# Patient Record
Sex: Female | Born: 1940
Health system: Southern US, Community
[De-identification: ages and names within clinical notes are randomized; demographics above are authoritative.]

## PROBLEM LIST (undated history)

## (undated) DIAGNOSIS — C9110 Chronic lymphocytic leukemia of B-cell type not having achieved remission: Principal | ICD-10-CM

## (undated) HISTORY — DX: Chronic lymphocytic leukemia of B-cell type not having achieved remission: C91.10

---

## 1997-09-17 ENCOUNTER — Ambulatory Visit (HOSPITAL_COMMUNITY): Admission: RE | Admit: 1997-09-17 | Discharge: 1997-09-17 | Payer: Self-pay | Admitting: Internal Medicine

## 1998-12-23 ENCOUNTER — Ambulatory Visit (HOSPITAL_COMMUNITY): Admission: RE | Admit: 1998-12-23 | Discharge: 1998-12-23 | Payer: Self-pay | Admitting: Internal Medicine

## 1998-12-23 ENCOUNTER — Encounter: Payer: Self-pay | Admitting: Internal Medicine

## 2000-01-10 ENCOUNTER — Encounter: Payer: Self-pay | Admitting: Internal Medicine

## 2000-01-10 ENCOUNTER — Ambulatory Visit (HOSPITAL_COMMUNITY): Admission: RE | Admit: 2000-01-10 | Discharge: 2000-01-10 | Payer: Self-pay | Admitting: Internal Medicine

## 2001-01-11 ENCOUNTER — Encounter: Payer: Self-pay | Admitting: Internal Medicine

## 2001-01-11 ENCOUNTER — Ambulatory Visit (HOSPITAL_COMMUNITY): Admission: RE | Admit: 2001-01-11 | Discharge: 2001-01-11 | Payer: Self-pay | Admitting: Internal Medicine

## 2002-03-21 ENCOUNTER — Ambulatory Visit (HOSPITAL_COMMUNITY): Admission: RE | Admit: 2002-03-21 | Discharge: 2002-03-21 | Payer: Self-pay | Admitting: Internal Medicine

## 2002-03-21 ENCOUNTER — Encounter: Payer: Self-pay | Admitting: Internal Medicine

## 2003-04-30 ENCOUNTER — Ambulatory Visit (HOSPITAL_COMMUNITY): Admission: RE | Admit: 2003-04-30 | Discharge: 2003-04-30 | Payer: Self-pay | Admitting: Internal Medicine

## 2004-11-30 ENCOUNTER — Ambulatory Visit (HOSPITAL_COMMUNITY): Admission: RE | Admit: 2004-11-30 | Discharge: 2004-11-30 | Payer: Self-pay | Admitting: Internal Medicine

## 2005-01-05 ENCOUNTER — Ambulatory Visit: Payer: Self-pay | Admitting: Internal Medicine

## 2006-03-31 ENCOUNTER — Ambulatory Visit (HOSPITAL_COMMUNITY): Admission: RE | Admit: 2006-03-31 | Discharge: 2006-03-31 | Payer: Self-pay | Admitting: Internal Medicine

## 2006-05-15 ENCOUNTER — Ambulatory Visit: Payer: Self-pay | Admitting: Gastroenterology

## 2006-05-24 ENCOUNTER — Ambulatory Visit: Payer: Self-pay | Admitting: Gastroenterology

## 2007-04-09 ENCOUNTER — Ambulatory Visit (HOSPITAL_COMMUNITY): Admission: RE | Admit: 2007-04-09 | Discharge: 2007-04-09 | Payer: Self-pay | Admitting: Internal Medicine

## 2008-04-24 ENCOUNTER — Ambulatory Visit (HOSPITAL_COMMUNITY): Admission: RE | Admit: 2008-04-24 | Discharge: 2008-04-24 | Payer: Self-pay | Admitting: Internal Medicine

## 2009-05-15 ENCOUNTER — Ambulatory Visit (HOSPITAL_COMMUNITY): Admission: RE | Admit: 2009-05-15 | Discharge: 2009-05-15 | Payer: Self-pay | Admitting: Internal Medicine

## 2009-06-19 DIAGNOSIS — E669 Obesity, unspecified: Secondary | ICD-10-CM | POA: Insufficient documentation

## 2009-06-19 DIAGNOSIS — E663 Overweight: Secondary | ICD-10-CM | POA: Insufficient documentation

## 2009-06-19 DIAGNOSIS — M1990 Unspecified osteoarthritis, unspecified site: Secondary | ICD-10-CM | POA: Insufficient documentation

## 2009-06-19 DIAGNOSIS — E785 Hyperlipidemia, unspecified: Secondary | ICD-10-CM | POA: Insufficient documentation

## 2009-06-19 DIAGNOSIS — J302 Other seasonal allergic rhinitis: Secondary | ICD-10-CM | POA: Insufficient documentation

## 2009-07-20 ENCOUNTER — Inpatient Hospital Stay (HOSPITAL_COMMUNITY): Admission: RE | Admit: 2009-07-20 | Discharge: 2009-07-23 | Payer: Self-pay | Admitting: Orthopedic Surgery

## 2009-09-22 DIAGNOSIS — R739 Hyperglycemia, unspecified: Secondary | ICD-10-CM | POA: Insufficient documentation

## 2009-12-09 ENCOUNTER — Ambulatory Visit (HOSPITAL_COMMUNITY): Admission: RE | Admit: 2009-12-09 | Discharge: 2009-12-09 | Payer: Self-pay | Admitting: Orthopedic Surgery

## 2009-12-09 ENCOUNTER — Ambulatory Visit: Payer: Self-pay | Admitting: Surgery

## 2009-12-09 ENCOUNTER — Encounter (INDEPENDENT_AMBULATORY_CARE_PROVIDER_SITE_OTHER): Payer: Self-pay | Admitting: Orthopedic Surgery

## 2009-12-10 ENCOUNTER — Ambulatory Visit: Payer: Self-pay | Admitting: Vascular Surgery

## 2010-06-09 ENCOUNTER — Ambulatory Visit (HOSPITAL_COMMUNITY)
Admission: RE | Admit: 2010-06-09 | Discharge: 2010-06-09 | Payer: Self-pay | Source: Home / Self Care | Attending: Internal Medicine | Admitting: Internal Medicine

## 2010-07-06 ENCOUNTER — Other Ambulatory Visit: Payer: Self-pay | Admitting: Orthopedic Surgery

## 2010-07-12 ENCOUNTER — Inpatient Hospital Stay (HOSPITAL_COMMUNITY)
Admission: RE | Admit: 2010-07-12 | Discharge: 2010-07-15 | Payer: Self-pay | Source: Home / Self Care | Attending: Orthopedic Surgery | Admitting: Orthopedic Surgery

## 2010-07-19 LAB — CBC
HCT: 34 % — ABNORMAL LOW (ref 36.0–46.0)
HCT: 29.9 % — ABNORMAL LOW (ref 36.0–46.0)
HCT: 27.9 % — ABNORMAL LOW (ref 36.0–46.0)
Hemoglobin: 11.2 g/dL — ABNORMAL LOW (ref 12.0–15.0)
Hemoglobin: 10.1 g/dL — ABNORMAL LOW (ref 12.0–15.0)
Hemoglobin: 9.3 g/dL — ABNORMAL LOW (ref 12.0–15.0)
MCH: 30.2 pg (ref 26.0–34.0)
MCH: 30.1 pg (ref 26.0–34.0)
MCH: 30 pg (ref 26.0–34.0)
MCHC: 32.9 g/dL (ref 30.0–36.0)
MCHC: 33.8 g/dL (ref 30.0–36.0)
MCHC: 33.3 g/dL (ref 30.0–36.0)
MCV: 91.6 fL (ref 78.0–100.0)
MCV: 89.3 fL (ref 78.0–100.0)
MCV: 90 fL (ref 78.0–100.0)
Platelets: 248 10*3/uL (ref 150–400)
Platelets: 184 10*3/uL (ref 150–400)
Platelets: 185 10*3/uL (ref 150–400)
RBC: 3.71 MIL/uL — ABNORMAL LOW (ref 3.87–5.11)
RBC: 3.35 MIL/uL — ABNORMAL LOW (ref 3.87–5.11)
RBC: 3.1 MIL/uL — ABNORMAL LOW (ref 3.87–5.11)
RDW: 14.4 % (ref 11.5–15.5)
RDW: 14.1 % (ref 11.5–15.5)
RDW: 14.4 % (ref 11.5–15.5)
WBC: 20.7 10*3/uL — ABNORMAL HIGH (ref 4.0–10.5)
WBC: 19.1 10*3/uL — ABNORMAL HIGH (ref 4.0–10.5)
WBC: 16.2 10*3/uL — ABNORMAL HIGH (ref 4.0–10.5)

## 2010-07-19 LAB — BASIC METABOLIC PANEL
BUN: 10 mg/dL (ref 6–23)
BUN: 5 mg/dL — ABNORMAL LOW (ref 6–23)
BUN: 8 mg/dL (ref 6–23)
CO2: 27 mEq/L (ref 19–32)
CO2: 29 mEq/L (ref 19–32)
CO2: 29 mEq/L (ref 19–32)
Calcium: 8.6 mg/dL (ref 8.4–10.5)
Calcium: 8.7 mg/dL (ref 8.4–10.5)
Calcium: 8.6 mg/dL (ref 8.4–10.5)
Chloride: 102 mEq/L (ref 96–112)
Chloride: 98 mEq/L (ref 96–112)
Chloride: 100 mEq/L (ref 96–112)
Creatinine, Ser: 0.65 mg/dL (ref 0.4–1.2)
Creatinine, Ser: 0.56 mg/dL (ref 0.4–1.2)
Creatinine, Ser: 0.62 mg/dL (ref 0.4–1.2)
GFR calc Af Amer: >60 mL/min (ref >60)
GFR calc Af Amer: >60 mL/min (ref >60)
GFR calc Af Amer: >60 mL/min (ref >60)
GFR calc non Af Amer: >60 mL/min (ref >60)
GFR calc non Af Amer: >60 mL/min (ref >60)
GFR calc non Af Amer: >60 mL/min (ref >60)
Glucose, Bld: 152 mg/dL — ABNORMAL HIGH (ref 70–99)
Glucose, Bld: 158 mg/dL — ABNORMAL HIGH (ref 70–99)
Glucose, Bld: 123 mg/dL — ABNORMAL HIGH (ref 70–99)
Potassium: 4 mEq/L (ref 3.5–5.1)
Potassium: 3.9 mEq/L (ref 3.5–5.1)
Potassium: 3.8 mEq/L (ref 3.5–5.1)
Sodium: 137 mEq/L (ref 135–145)
Sodium: 133 mEq/L — ABNORMAL LOW (ref 135–145)
Sodium: 137 mEq/L (ref 135–145)

## 2010-07-19 LAB — DIFFERENTIAL
Basophils Absolute: 0 10*3/uL (ref 0.0–0.1)
Basophils Relative: 0 % (ref 0–1)
Eosinophils Absolute: 0 10*3/uL (ref 0.0–0.7)
Eosinophils Relative: 0 % (ref 0–5)
Lymphocytes Relative: 46 % (ref 12–46)
Lymphs Abs: 8.8 10*3/uL — ABNORMAL HIGH (ref 0.7–4.0)
Monocytes Absolute: 1.5 10*3/uL — ABNORMAL HIGH (ref 0.1–1.0)
Monocytes Relative: 8 % (ref 3–12)
Neutro Abs: 8.8 10*3/uL — ABNORMAL HIGH (ref 1.7–7.7)
Neutrophils Relative %: 46 % (ref 43–77)

## 2010-07-19 LAB — PROTIME-INR
INR: 1.11 (ref 0.00–1.49)
INR: 1.44 (ref 0.00–1.49)
INR: 1.79 — ABNORMAL HIGH (ref 0.00–1.49)
Prothrombin Time: 14.5 seconds (ref 11.6–15.2)
Prothrombin Time: 17.7 seconds — ABNORMAL HIGH (ref 11.6–15.2)
Prothrombin Time: 21 seconds — ABNORMAL HIGH (ref 11.6–15.2)

## 2010-07-19 LAB — TYPE AND SCREEN
ABO/RH(D): O NEG
Antibody Screen: NEGATIVE

## 2010-07-19 LAB — PATHOLOGIST SMEAR REVIEW

## 2010-07-25 ENCOUNTER — Encounter: Payer: Self-pay | Admitting: Internal Medicine

## 2010-08-25 NOTE — Discharge Summary (Signed)
NAMECAYLEA, [name]                ACCOUNT NO.:  [id number]  MEDICAL RECORD NO.:  [id number]          PATIENT TYPE:  INP  LOCATION:  1613                         FACILITY:  [facility]  PHYSICIAN:  Ollen Gross, M.D.    DATE OF BIRTH:  [birth year]  DATE OF ADMISSION:  07/12/2010 DATE OF DISCHARGE:  07/15/2010                              DISCHARGE SUMMARY   ADMITTING DIAGNOSES: 1. Osteoarthritis, left knee. 2. Seasonal allergies. 3. Osteoarthritis. 4. Hyperlipidemia. 5. Past history of pneumonia. 6. Postmenopausal. 7. Childhood illnesses of measles, mumps and rubella. 8. Chronic leukocytosis.  DISCHARGE DIAGNOSES: 1. Osteoarthritis, left knee, status post left total knee replacement     arthroplasty. 2. Postop hyponatremia, improved. 3. Mild postop acute blood loss anemia, did not require transfusion. 4. Leukocytosis. 5. Seasonal allergies. 6. Osteoarthritis. 7. Hyperlipidemia. 8. Past history of pneumonia. 9. Postmenopausal. 10.Childhood illnesses of measles, mumps and rubella.  PROCEDURE:  07/12/2010 left total knee.  Surgeon Dr. Lequita Halt.  Assistant Avel Peace PA-C.  Anesthesia general.  Tourniquet time 34 minutes.  CONSULTS:  Internal medicine, Dr. Creola Corn.  BRIEF HISTORY:  [name] is a 70 year old female with end-stage osteoarthritis of left knee, progressive worsening pain, dysfunction, successful right total knee, who now presents for a left total knee.  PERTINENT LABORATORY AND X-RAY DATA:  Preop CBC showed a slightly elevated white count of 15.5, hemoglobin of 14.9, hematocrit of 45.2, platelets 269.  PT/INR was 13.8, 1.04 with PTT of 35.  Chem panel on admission all within normal limits with the exception of minimally elevated glucose of 113.  Preop UA negative.  Blood group type O negative.  Nasal swabs were negative for Staph aureus MRSA.  Serial CBCs were followed.  Hemoglobin dropped down to 11.2 and 10.1, last done H&H showed 9.3.  The white count  did go up probably do to a stress reaction after surgery from 15.5 to 20.7, back down to 19.11; white count was last noted at 16.2 near back to baseline.  Serial Pro-times followed per Coumadin protocol; last done PT/INR 21.0 and 1.79.  Serial BMETs were followed for 3 days.  His sodium did drop from 140 to 137, got as low as 133, it was back up to 137.  Glucose went up from 113, got as high as 158, back down to 123.  Chest x-ray, portable chest on 07/13/2010, scar versus atelectasis within the left lung base.  HOSPITAL COURSE:  The patient was admitted to [facility], taken to OR, underwent the above-stated procedure without complication. The patient tolerated the procedure well, later transferred to the recovery room, orthopedic floor, started on p.o. and IV analgesic for pain control following surgery.  Courtesy medical consult by Dr. Timothy Lasso, the patient was seen and noted to have leukocytosis.  The patient was known to have chronic baseline white count from 11-15 and the white count went up to 20 postop.  It was felt more than likely it was due to a stress on top of the chronic leukocytosis.  She was actually doing pretty well although she did not get much sleep.  She wanted to go home  after surgery.  Hemovac drain was pulled on day #1 without difficulty. She was started back on her home meds, encouraged incentive spirometer, put on Coumadin for DVT prophylaxis.  By day #2, her white count was 19.1.  There were no signs of infection and felt to be more of an atypical leukocytosis and should follow up with Dr. Timothy Lasso on an outpatient basis, and if she needed any further workup, then we will defer to Dr. Timothy Lasso.  Her sodium was down by day #2, felt to be more to a dilutional component.  We allowed her to diurese her fluids and we stopped the IV fluids.  Dressing changed on day #2, incision looked good.  By day #3, she was feeling better, walking over 75 feet, white count was  trending back down closer to preop, down to 16.2.  Her hemoglobin was down to 9.3.  She was asymptomatic with this though.  She wanted try some Restoril at home, so we are going to add  that to her prescriptions.  She is doing well, discharged home.  DISCHARGE PLAN: 1. The patient discharged home on 07/15/2010. 2. Discharge diagnoses, please see above. 3. Discharge medications:  Percocet, Robaxin, Coumadin, Restoril, also     continue the Zyrtec. 4. Follow up in 2 weeks.  ACTIVITY:  Weightbearing as tolerated, total knee protocol.  Home health PT, home health nursing.  DIET:  Heart-healthy diet.  DISPOSITION:  Home.  CONDITION ON DISCHARGE:  Improving.  Dictated For:  Gus Rankin. Darlyn Repsher, MD.     Alexzandrew L. Julien Girt, P.A.C.   ______________________________ Ollen Gross, M.D.    ALP/MEDQ  D:  07/15/2010  T:  07/15/2010  Job:  [id number]  cc:   Lauren Pounds, MD Fax: [phone]  Electronically Signed by Patrica Duel P.A.C. on 08/24/2010 07:46:27 AM Electronically Signed by Ollen Gross M.D. on 08/25/2010 04:47:07 PM

## 2010-09-13 LAB — COMPREHENSIVE METABOLIC PANEL
ALT: 20 U/L (ref 0–35)
AST: 19 U/L (ref 0–37)
Albumin: 4.3 g/dL (ref 3.5–5.2)
Alkaline Phosphatase: 72 U/L (ref 39–117)
BUN: 13 mg/dL (ref 6–23)
CO2: 27 mEq/L (ref 19–32)
Calcium: 10 mg/dL (ref 8.4–10.5)
Chloride: 104 mEq/L (ref 96–112)
Creatinine, Ser: 0.56 mg/dL (ref 0.4–1.2)
GFR calc Af Amer: >60 mL/min (ref >60)
GFR calc non Af Amer: >60 mL/min (ref >60)
Glucose, Bld: 113 mg/dL — ABNORMAL HIGH (ref 70–99)
Potassium: 4.4 mEq/L (ref 3.5–5.1)
Sodium: 141 mEq/L (ref 135–145)
Total Bilirubin: 1.1 mg/dL (ref 0.3–1.2)
Total Protein: 7.4 g/dL (ref 6.0–8.3)

## 2010-09-13 LAB — URINALYSIS, ROUTINE W REFLEX MICROSCOPIC
Bilirubin Urine: NEGATIVE
Glucose, UA: NEGATIVE mg/dL
Hgb urine dipstick: NEGATIVE
Ketones, ur: NEGATIVE mg/dL
Nitrite: NEGATIVE
Protein, ur: NEGATIVE mg/dL
Specific Gravity, Urine: 1.015 (ref 1.005–1.030)
Urobilinogen, UA: 0.2 mg/dL (ref 0.0–1.0)
pH: 6 (ref 5.0–8.0)

## 2010-09-13 LAB — SURGICAL PCR SCREEN
MRSA, PCR: NEGATIVE
Staphylococcus aureus: NEGATIVE

## 2010-09-13 LAB — CBC
HCT: 45.2 % (ref 36.0–46.0)
Hemoglobin: 14.9 g/dL (ref 12.0–15.0)
MCH: 30.1 pg (ref 26.0–34.0)
MCHC: 33 g/dL (ref 30.0–36.0)
MCV: 91.3 fL (ref 78.0–100.0)
Platelets: 269 10*3/uL (ref 150–400)
RBC: 4.95 MIL/uL (ref 3.87–5.11)
RDW: 13.9 % (ref 11.5–15.5)
WBC: 15.5 10*3/uL — ABNORMAL HIGH (ref 4.0–10.5)

## 2010-09-13 LAB — PROTIME-INR: Prothrombin Time: 13.8 seconds (ref 11.6–15.2)

## 2010-09-19 LAB — COMPREHENSIVE METABOLIC PANEL
AST: 18 U/L (ref 0–37)
Albumin: 4.4 g/dL (ref 3.5–5.2)
Alkaline Phosphatase: 69 U/L (ref 39–117)
BUN: 13 mg/dL (ref 6–23)
CO2: 27 mEq/L (ref 19–32)
Chloride: 105 mEq/L (ref 96–112)
Creatinine, Ser: 0.56 mg/dL (ref 0.4–1.2)
GFR calc non Af Amer: >60 mL/min (ref >60)
Potassium: 4.1 mEq/L (ref 3.5–5.1)
Total Bilirubin: 1.1 mg/dL (ref 0.3–1.2)

## 2010-09-19 LAB — URINALYSIS, ROUTINE W REFLEX MICROSCOPIC
Bilirubin Urine: NEGATIVE
Glucose, UA: NEGATIVE mg/dL
Hgb urine dipstick: NEGATIVE
Ketones, ur: NEGATIVE mg/dL
Protein, ur: NEGATIVE mg/dL
pH: 6.5 (ref 5.0–8.0)

## 2010-09-19 LAB — CBC
HCT: 41.5 % (ref 36.0–46.0)
Hemoglobin: 13.9 g/dL (ref 12.0–15.0)
MCV: 89.5 fL (ref 78.0–100.0)
Platelets: 278 10*3/uL (ref 150–400)
RBC: 4.64 MIL/uL (ref 3.87–5.11)
WBC: 14.7 10*3/uL — ABNORMAL HIGH (ref 4.0–10.5)

## 2010-09-19 LAB — APTT: aPTT: 34 seconds (ref 24–37)

## 2010-09-19 LAB — PROTIME-INR: INR: 1.02 (ref 0.00–1.49)

## 2010-09-19 LAB — TYPE AND SCREEN

## 2010-09-20 LAB — PROTIME-INR
Prothrombin Time: 17.1 seconds — ABNORMAL HIGH (ref 11.6–15.2)
Prothrombin Time: 18 seconds — ABNORMAL HIGH (ref 11.6–15.2)

## 2010-09-20 LAB — BASIC METABOLIC PANEL
BUN: 12 mg/dL (ref 6–23)
BUN: 4 mg/dL — ABNORMAL LOW (ref 6–23)
BUN: 5 mg/dL — ABNORMAL LOW (ref 6–23)
CO2: 27 mEq/L (ref 19–32)
CO2: 27 mEq/L (ref 19–32)
Calcium: 8.1 mg/dL — ABNORMAL LOW (ref 8.4–10.5)
Calcium: 8.3 mg/dL — ABNORMAL LOW (ref 8.4–10.5)
Chloride: 102 mEq/L (ref 96–112)
Creatinine, Ser: 0.57 mg/dL (ref 0.4–1.2)
Creatinine, Ser: 0.59 mg/dL (ref 0.4–1.2)
GFR calc non Af Amer: >60 mL/min (ref >60)
Glucose, Bld: 129 mg/dL — ABNORMAL HIGH (ref 70–99)
Glucose, Bld: 130 mg/dL — ABNORMAL HIGH (ref 70–99)
Glucose, Bld: 175 mg/dL — ABNORMAL HIGH (ref 70–99)
Potassium: 3.8 mEq/L (ref 3.5–5.1)
Potassium: 4 mEq/L (ref 3.5–5.1)
Sodium: 135 mEq/L (ref 135–145)

## 2010-09-20 LAB — CBC
HCT: 27.2 % — ABNORMAL LOW (ref 36.0–46.0)
HCT: 31.3 % — ABNORMAL LOW (ref 36.0–46.0)
Hemoglobin: 10.5 g/dL — ABNORMAL LOW (ref 12.0–15.0)
MCHC: 33.4 g/dL (ref 30.0–36.0)
MCHC: 34.1 g/dL (ref 30.0–36.0)
MCV: 89.2 fL (ref 78.0–100.0)
Platelets: 187 10*3/uL (ref 150–400)
Platelets: 194 10*3/uL (ref 150–400)
Platelets: 254 10*3/uL (ref 150–400)
RDW: 13.7 % (ref 11.5–15.5)
RDW: 13.8 % (ref 11.5–15.5)
RDW: 13.9 % (ref 11.5–15.5)

## 2010-11-16 NOTE — Consult Note (Signed)
VASCULAR SURGERY CONSULTATION   [name], [name]  DOB:  [birth year]                                       12/10/2009  ZOXWR#:[id number]   I saw the patient in the office today in consultation concerning  swelling of her right leg.  This is a pleasant 70 year old woman who had  undergone a total knee replacement arthroplasty by Dr. Lequita Halt on  July 20, 2009, for osteoarthritis of the right knee.  She has done  very well since her surgery and initially had some swelling in the leg  as expected, but this ultimately improved.  She has been ambulating 1-  1/2 miles a day 4-5 times a week.  Approximately 3 days ago she began  developing swelling in the right leg from the knee down.  This has  persisted.  She does not remember any specific injury to the leg.  She  had had no recent travel and has not had any prolonged standing more  than normal recently.  She has had no previous history of DVT.  She did  undergo a venous duplex scan at [facility] on December 09, 2009, which  showed no evidence of DVT.  She was referred today for evaluation of her  swelling.   PAST MEDICAL HISTORY:  Fairly unremarkable.  She denies any history of  diabetes, hypertension, history of previous myocardial infarction,  history of congestive heart failure or history of COPD.  She does have a  history of hyperlipidemia.   PAST SURGICAL HISTORY:  Significant for previous hysterectomy and the  total knee replacement as described above.   SOCIAL HISTORY:  She is married.  She has 4 children.  She quit tobacco  in 1979.   FAMILY HISTORY:  There is no history of premature cardiovascular  disease.   REVIEW OF SYSTEMS:  GENERAL:  She has had no recent weight loss, weight  gain or problems in her appetite.  CARDIOVASCULAR:  She had no chest pain, chest pressure, palpitations or  arrhythmias.  She has had no orthopnea or significant dyspnea on  exertion.  She had no claudication, rest pain or  nonhealing ulcers.  She  has had no history of stroke, TIAs or amaurosis fugax.  She has had no  previous history of DVT.  GI, neurologic, pulmonary, hematologic, GU, ENT, musculoskeletal,  psychiatric review of systems is unremarkable and is documented on the  medical history form in her chart.   PHYSICAL EXAMINATION:  This is a pleasant 70 year old woman who appears  her stated age.  Blood pressure is 169/98, heart rate is 78, temperature  97.9.  HEENT is unremarkable.  Lungs are clear bilaterally to  auscultation without rales, rhonchi or wheezing.  On cardiovascular exam  I do not detect any carotid bruits.  She has a regular rate and rhythm  without murmur.  She has palpable femoral pulses and palpable posterior  tibial pulses bilaterally.  Both feet are warm and well-perfused.  Extremity exam reveals a moderate 2+ edema of her right leg from her  knee down.  There is no significant swelling on the left.  Neurologic  exam:  She has no focal weakness or paresthesias.  Skin:  There are no  ulcers or rashes.   Her duplex scan was reviewed and showed no evidence of deep venous  thrombosis.   I have  explained to the patient that I think her swelling in the right  leg is related to a lymphedema.  Although it is somewhat usual to have  it present like this in a delayed fashion with no inciting event, the  exam is consistent with lymphedema.  Given the negative venous duplex  scan, this would be the most likely diagnosis.  We have discussed the  importance of intermittent leg elevation.  I have instructed her on the  proper position for this.  I have encouraged her to do this for 20-30  minutes at least 3 times a day until the swelling has improving and she  can be fitted for a compression stocking.  I have written her a  prescription for knee-high compression stockings with a mild gradient  for now of 15-20 mmHg.  If her swelling persists despite aggressive  treatment with elevation  and compression, then consideration could be  given to further studies such as a CT scan to look for some type of a  compressive mass.  I would think this is very unlikely and, again, I  think this most likely is simply a lymphedema.  I will be happy to see  her back at any time if her swelling does not improve.     Di Kindle. Edilia Bo, M.D.  Electronically Signed  CSD/MEDQ  D:  12/10/2009  T:  12/11/2009  Job:  [id number]   cc:   Ollen Gross, M.D.

## 2011-04-01 DIAGNOSIS — R5381 Other malaise: Secondary | ICD-10-CM | POA: Insufficient documentation

## 2011-06-01 ENCOUNTER — Telehealth: Payer: Self-pay | Admitting: Oncology

## 2011-06-01 NOTE — Telephone Encounter (Signed)
lmonvm advising the pt of her new pt appt on 06/08/2011 and to call me back to see if this d/t will work for them

## 2011-06-08 ENCOUNTER — Other Ambulatory Visit: Payer: Self-pay | Admitting: Lab

## 2011-06-08 ENCOUNTER — Ambulatory Visit: Payer: Self-pay

## 2011-06-08 ENCOUNTER — Ambulatory Visit: Payer: Self-pay | Admitting: Oncology

## 2011-06-24 ENCOUNTER — Other Ambulatory Visit: Payer: Self-pay | Admitting: Oncology

## 2011-06-24 DIAGNOSIS — D72820 Lymphocytosis (symptomatic): Secondary | ICD-10-CM

## 2011-07-01 ENCOUNTER — Encounter: Payer: Self-pay | Admitting: *Deleted

## 2011-07-08 ENCOUNTER — Other Ambulatory Visit: Payer: Medicare Other

## 2011-07-08 ENCOUNTER — Ambulatory Visit (HOSPITAL_BASED_OUTPATIENT_CLINIC_OR_DEPARTMENT_OTHER): Payer: Medicare Other

## 2011-07-08 ENCOUNTER — Other Ambulatory Visit: Payer: Self-pay | Admitting: Oncology

## 2011-07-08 ENCOUNTER — Other Ambulatory Visit (HOSPITAL_COMMUNITY)
Admission: RE | Admit: 2011-07-08 | Discharge: 2011-07-08 | Disposition: A | Discharge status: Home / Self Care | Payer: Medicare Other | Source: Ambulatory Visit | Attending: Oncology | Admitting: Oncology

## 2011-07-08 ENCOUNTER — Ambulatory Visit (HOSPITAL_BASED_OUTPATIENT_CLINIC_OR_DEPARTMENT_OTHER): Payer: Medicare Other | Admitting: Oncology

## 2011-07-08 VITALS — BP 183/101 | HR 75 | Temp 97.1°F | Ht 68.5 in | Wt 221.3 lb

## 2011-07-08 DIAGNOSIS — D72820 Lymphocytosis (symptomatic): Secondary | ICD-10-CM

## 2011-07-08 DIAGNOSIS — C9110 Chronic lymphocytic leukemia of B-cell type not having achieved remission: Secondary | ICD-10-CM

## 2011-07-08 LAB — CBC WITH DIFFERENTIAL/PLATELET
BASO%: 0.3 % (ref 0.0–2.0)
LYMPH%: 60 % — ABNORMAL HIGH (ref 14.0–49.7)
MCHC: 33.5 g/dL (ref 31.5–36.0)
MCV: 88.4 fL (ref 79.5–101.0)
MONO#: 0.5 10*3/uL (ref 0.1–0.9)
MONO%: 3.7 % (ref 0.0–14.0)
Platelets: 289 10*3/uL (ref 145–400)
RBC: 4.53 10*6/uL (ref 3.70–5.45)
WBC: 13.8 10*3/uL — ABNORMAL HIGH (ref 3.9–10.3)

## 2011-07-08 LAB — COMPREHENSIVE METABOLIC PANEL
ALT: 21 U/L (ref 0–35)
Alkaline Phosphatase: 82 U/L (ref 39–117)
Sodium: 141 mEq/L (ref 135–145)
Total Bilirubin: 0.9 mg/dL (ref 0.3–1.2)
Total Protein: 6.7 g/dL (ref 6.0–8.3)

## 2011-07-08 NOTE — Progress Notes (Signed)
Note dictated

## 2011-07-08 NOTE — Progress Notes (Signed)
CC:   Gwen Pounds, MD  REASON FOR CONSULTATION:  Leukocytosis with lymphocytosis.  HISTORY OF PRESENT ILLNESS:  Mrs. Feldkamp is a pleasant 71 year old woman, a native of [place], currently resides in [place] since 1980.  She is a rather healthy woman with really no significant past medical history. She does have a history of slight obesity and osteoarthritis.  She gets her routine care at [facility] under the care of Dr. Timothy Lasso.  She had reported feeling fatigued and "lousy" back in October 2012.  At that time her CBC showed mild leukocytosis with a total white cell count of 16.8.  Lymphocyte percentage at that time was at 57.6. She had a normal hemoglobin of 14.  Platelet count was 311.  She had a repeat CBC on November 20th that showed her white cell count was 14.9, hemoglobin was 14, platelet count was normal at 264.  Lymphocyte percentage was 66% with the upper limit of normal being 46.  The differential reviewed by Pathology showed that she had atypical lymphocytes with smudge cells present at that time.  For that reason, the patient was referred to me for evaluation regarding her lymphocytosis.  Upon interviewing Mrs. Kernen, she reports she is completely asymptomatic at this point.  She has felt well.  She had not had any major problems.  She had not had any weakness or fatigue.  Her malaise has really resolved at this point.  She had a good holiday that was rather busy, and she was able to perform most activities of daily living.  She had not reported any fevers, did not report any chills, did not report any dysphagia, did not report no lymphadenopathy, did not report any real decline in her energy or performance status.  Looking back to her previous laboratory values, she had fluctuation of her leukocytosis and lymphocytosis dating back to January 2011, where her white cell count was 13.9.  It went up to as high as 20 on July 13, 2010, and now, as mentioned,  is about 13,000 or 14,000.  REVIEW OF SYSTEMS:  She did not report any headaches, blurry vision, double vision.  Did not report any motor or sensory neuropathy.  Did not report any alteration in mental status.  Did not report any psychiatric issues or depression.  Did not report any fever, chills, sweats.  Did not report any cough, hemoptysis, hematemesis.  No nausea or vomiting. No abdominal pain.  No hematochezia, melena or genitourinary complaints. No frequency, urgency, or hesitancy.  No musculoskeletal complaints.  No arthralgias or myalgias.  No heat or cold intolerance.  No bleeding or clotting diathesis.  The rest of the review of systems is unremarkable.  PAST MEDICAL HISTORY:  Significant for hyperlipidemia, history of osteoarthritis, and she is also status post knee replacements.  MEDICATIONS:  She is predominantly on vitamin supplements.  ALLERGIES:  Penicillin.  SOCIAL HISTORY:  She is married.  She has 4 children and a number of grandchildren.  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  She quit smoking in the 1970s.  She drinks wine very infrequently.  FAMILY HISTORY:  Really unremarkable for any blood disorders or any malignancies.  Her mother had cirrhosis of the liver.  Father had heart disease.  PHYSICAL EXAMINATION:  An alert, awake female who appeared in no active distress.  Blood pressure 183/100, pulse 75, respirations 20.  Weight is 221 pounds.  Head is normocephalic, atraumatic.  Pupils equal, round and reactive to light.  Oral mucosa  is moist and pink.  Neck is supple without adenopathy.  Heart is regular in rate and rhythm.  S1, S2. Lungs are clear auscultation without rhonchi, wheezes, or dullness to percussion.  Abdomen is soft, nontender.  No hepatosplenomegaly. Extremities:  No clubbing, cyanosis, or edema.  Neurologic:  Intact motor, sensory, and deep tendon reflexes.  LABORATORY DATA:  Hemoglobin 13.4, white cell count 13.8, platelet count of 289,  lymphocyte percentage 60%, absolute lymphocyte count of 8300. Peripheral smear is reviewed and shows, again, an elevated lymphocyte count.  There is no evidence of any dysplasia, atypical lymphocytes noted, some smudge cells noted, no evidence of any red cell abnormalities.  ASSESSMENT AND PLAN:  This is a pleasant 71 year old woman with the following issue:  Lymphocytosis.  The differential diagnosis at this time includes a monoclonal lymphocytosis, versus CLL, versus prolymphocytic leukemia, versus other benign reactive lymphocytosis. Overall, I think the pattern fits more of a benign monoclonal lymphocytosis versus CLL.  To really determine that for sure, a flow cytometry has been ordered.  The results are currently pending.  I had a lengthy.  I had a lengthy discussion today with Mrs. Paz, discussing the natural course of lymphocytosis or pre-CLL conditions, and overall, I feel the natural course of this disease is rather lengthy.  She has really had a fluctuation of her white cell count for least 2 years, probably even longer if we find records beyond the last few years. Overall, I feel that she has not really any evidence of lymphadenopathy. She does not have any evidence of splenomegaly.  She does not have any symptomatic opportunistic infections.  Overall, I feel that her disease is really, if it is indeed CLL, then early stage, indicating maybe stage 0.  I explained to her that at this time, given the natural course of this disease, no real treatment is warranted, just observation, watch and wait.  The odds are she will probably not develop any symptoms for years to come.  However, I would like to continue to monitor her.  I will repeat her laboratory data in 6 months, and imaging studies as needed, such as CT scan of the chest, abdomen and pelvis.    ______________________________ Benjiman Core, M.D. FNS/MEDQ  D:  07/08/2011  T:  07/08/2011  Job:  [id number]

## 2011-07-11 ENCOUNTER — Other Ambulatory Visit (HOSPITAL_COMMUNITY): Payer: Self-pay | Admitting: Internal Medicine

## 2011-07-11 DIAGNOSIS — Z1231 Encounter for screening mammogram for malignant neoplasm of breast: Secondary | ICD-10-CM

## 2011-07-12 ENCOUNTER — Ambulatory Visit (HOSPITAL_COMMUNITY)
Admission: RE | Admit: 2011-07-12 | Discharge: 2011-07-12 | Disposition: A | Discharge status: Home / Self Care | Payer: Medicare Other | Source: Ambulatory Visit | Attending: Internal Medicine | Admitting: Internal Medicine

## 2011-07-12 DIAGNOSIS — Z1231 Encounter for screening mammogram for malignant neoplasm of breast: Secondary | ICD-10-CM | POA: Insufficient documentation

## 2011-08-03 ENCOUNTER — Ambulatory Visit: Payer: Self-pay | Admitting: Oncology

## 2011-12-05 DIAGNOSIS — N329 Bladder disorder, unspecified: Secondary | ICD-10-CM | POA: Insufficient documentation

## 2011-12-05 DIAGNOSIS — R030 Elevated blood-pressure reading, without diagnosis of hypertension: Secondary | ICD-10-CM | POA: Insufficient documentation

## 2012-01-04 ENCOUNTER — Ambulatory Visit: Payer: Medicare Other | Admitting: Oncology

## 2012-01-04 ENCOUNTER — Other Ambulatory Visit: Payer: Medicare Other | Admitting: Lab

## 2012-01-04 ENCOUNTER — Telehealth: Payer: Self-pay | Admitting: Oncology

## 2012-01-04 NOTE — Telephone Encounter (Signed)
S/w pt and r/s'd 7/3 appt to 7/31 per pt out of town.

## 2012-02-01 ENCOUNTER — Telehealth: Payer: Self-pay | Admitting: Oncology

## 2012-02-01 ENCOUNTER — Other Ambulatory Visit (HOSPITAL_BASED_OUTPATIENT_CLINIC_OR_DEPARTMENT_OTHER): Payer: Medicare Other | Admitting: Lab

## 2012-02-01 ENCOUNTER — Ambulatory Visit (HOSPITAL_BASED_OUTPATIENT_CLINIC_OR_DEPARTMENT_OTHER): Payer: Medicare Other | Admitting: Oncology

## 2012-02-01 VITALS — BP 176/100 | HR 68 | Temp 98.0°F | Ht 68.5 in | Wt 218.8 lb

## 2012-02-01 DIAGNOSIS — C9110 Chronic lymphocytic leukemia of B-cell type not having achieved remission: Secondary | ICD-10-CM

## 2012-02-01 DIAGNOSIS — D72820 Lymphocytosis (symptomatic): Secondary | ICD-10-CM

## 2012-02-01 LAB — CBC WITH DIFFERENTIAL/PLATELET
BASO%: 0.4 % (ref 0.0–2.0)
Basophils Absolute: 0.1 10*3/uL (ref 0.0–0.1)
EOS%: 1.1 % (ref 0.0–7.0)
Eosinophils Absolute: 0.2 10*3/uL (ref 0.0–0.5)
HCT: 41.3 % (ref 34.8–46.6)
HGB: 13.7 g/dL (ref 11.6–15.9)
LYMPH%: 62.5 % — ABNORMAL HIGH (ref 14.0–49.7)
MCH: 29.4 pg (ref 25.1–34.0)
MCHC: 33.1 g/dL (ref 31.5–36.0)
MCV: 89 fL (ref 79.5–101.0)
MONO#: 0.6 10*3/uL (ref 0.1–0.9)
MONO%: 3.3 % (ref 0.0–14.0)
NEUT#: 5.7 10*3/uL (ref 1.5–6.5)
NEUT%: 32.7 % — ABNORMAL LOW (ref 38.4–76.8)
Platelets: 248 10*3/uL (ref 145–400)
RBC: 4.64 10*6/uL (ref 3.70–5.45)
RDW: 14 % (ref 11.2–14.5)
WBC: 17.5 10*3/uL — ABNORMAL HIGH (ref 3.9–10.3)
lymph#: 11 10*3/uL — ABNORMAL HIGH (ref 0.9–3.3)

## 2012-02-01 LAB — COMPREHENSIVE METABOLIC PANEL
AST: 19 U/L (ref 0–37)
Alkaline Phosphatase: 82 U/L (ref 39–117)
BUN: 18 mg/dL (ref 6–23)
Creatinine, Ser: 0.64 mg/dL (ref 0.50–1.10)

## 2012-02-01 LAB — TECHNOLOGIST REVIEW

## 2012-02-01 NOTE — Progress Notes (Signed)
Hematology and Oncology Follow Up Visit  [name] [id number] Nov 13, 1940 71 y.o. 02/01/2012 4:07 PM   Principle Diagnosis: 71 year old with lymphocytosis with the likely diagnosis of stage 0 CLL in 07/2011.   Current therapy: Observation and surveillance.   Interim History:  [name] presents for a follow up visit. She is 72 year old nice women I saw in 07/2011 for the evaluation for lymphocytosis. Her flow cytometry did show likely CLL. She is currently asymptomatic and have been doing well since her last visit. She had not reported any fevers, did not report any chills, did not report any dysphagia, did not report no lymphadenopathy, did not report any real decline in her energy or performance status.   Medications: I have reviewed the patient's current medications. Current outpatient prescriptions:cetirizine (ZYRTEC) 10 MG chewable tablet, Chew 10 mg by mouth daily. As needed for allergies , Disp: , Rfl: ;  fish oil-omega-3 fatty acids 1000 MG capsule, Take 2 g by mouth daily.  , Disp: , Rfl: ;  glucosamine-chondroitin 500-400 MG tablet, Take 1 tablet by mouth daily.  , Disp: , Rfl: ;  ibuprofen (ADVIL,MOTRIN) 200 MG tablet, Take 200 mg by mouth every 6 (six) hours as needed.  , Disp: , Rfl:  Multiple Vitamin (MULTIVITAMIN) capsule, Take 1 capsule by mouth daily.  , Disp: , Rfl:   Allergies:  Allergies  Allergen Reactions  . Bee Venom   . Penicillins     Past Medical History, Surgical history, Social history, and Family History were reviewed and updated.  Review of Systems: Constitutional:  Negative for fever, chills, night sweats, anorexia, weight loss, pain. Cardiovascular: no chest pain or dyspnea on exertion Respiratory: negative Neurological: no TIA or stroke symptoms Dermatological: negative ENT: negative Skin: Negative. Gastrointestinal: no abdominal pain, change in bowel habits, or black or bloody stools Genito-Urinary: negative Hematological and Lymphatic:  negative Breast: negative Musculoskeletal: negative Remaining ROS negative. Physical Exam: Blood pressure 176/100, pulse 68, temperature 98 F (36.7 C), temperature source Oral, height 5' 8.5" (1.74 m), weight 218 lb 12.8 oz (99.247 kg). ECOG: 0 General appearance: alert Head: Normocephalic, without obvious abnormality, atraumatic Neck: no adenopathy, no carotid bruit, no JVD, supple, symmetrical, trachea midline and thyroid not enlarged, symmetric, no tenderness/mass/nodules Lymph nodes: Cervical, supraclavicular, and axillary nodes normal. Heart:regular rate and rhythm, S1, S2 normal, no murmur, click, rub or gallop Lung:chest clear, no wheezing, rales, normal symmetric air entry Abdomin: soft, non-tender, without masses or organomegaly EXT:no erythema, induration, or nodules   Lab Results: Lab Results  Component Value Date   WBC 17.5* 02/01/2012   HGB 13.7 02/01/2012   HCT 41.3 02/01/2012   MCV 89.0 02/01/2012   PLT 248 02/01/2012     Chemistry      Component Value Date/Time   NA 141 07/08/2011 1054   K 4.5 07/08/2011 1054   CL 106 07/08/2011 1054   CO2 24 07/08/2011 1054   BUN 12 07/08/2011 1054   CREATININE 0.68 07/08/2011 1054      Component Value Date/Time   CALCIUM 9.6 07/08/2011 1054   ALKPHOS 82 07/08/2011 1054   AST 23 07/08/2011 1054   ALT 21 07/08/2011 1054   BILITOT 0.9 07/08/2011 1054      Impression and Plan:  This is a pleasant 71 year old woman with Lymphocytosis. The differential diagnosis at this time includes a monoclonal lymphocytosis, versus CLL. Her flow cytometry indicate possible stage 0 CLL.  She is asymptomatic at this point and requires no  treatment.. I would suggest just observation and watch and wait approach.The odds are she will probably not develop any symptoms for years to come. However, I would like to continue to monitor her. I will repeat her laboratory data in 6 months, and imaging studies as such as CT scan of the chest, abdomen and pelvis as needed.   I continued to educated her about possible consitutional  symptoms (fever, chills, sweats, etc.), infection, and possible cytopenias that could develop over a period to time.    [facility], MD 7/31/20134:07 PM

## 2012-02-01 NOTE — Telephone Encounter (Signed)
appts made made and printed for pt aom °

## 2012-08-01 ENCOUNTER — Other Ambulatory Visit: Payer: Medicare Other | Admitting: Lab

## 2012-08-01 ENCOUNTER — Ambulatory Visit: Payer: Medicare Other | Admitting: Oncology

## 2012-08-06 ENCOUNTER — Other Ambulatory Visit: Payer: Self-pay | Admitting: Oncology

## 2012-08-06 ENCOUNTER — Telehealth: Payer: Self-pay | Admitting: Oncology

## 2012-08-06 NOTE — Telephone Encounter (Signed)
S/W THE PT'S WIFE AND SHE IS AWARE OF THE [facility]ARCH APPTS. PT MISSED HIS JAN APPTS

## 2012-08-07 ENCOUNTER — Other Ambulatory Visit (HOSPITAL_COMMUNITY): Payer: Self-pay | Admitting: Internal Medicine

## 2012-08-07 DIAGNOSIS — Z1231 Encounter for screening mammogram for malignant neoplasm of breast: Secondary | ICD-10-CM

## 2012-08-24 ENCOUNTER — Ambulatory Visit (HOSPITAL_COMMUNITY)
Admission: RE | Admit: 2012-08-24 | Discharge: 2012-08-24 | Disposition: A | Discharge status: Home / Self Care | Payer: Medicare Other | Source: Ambulatory Visit | Attending: Internal Medicine | Admitting: Internal Medicine

## 2012-08-24 DIAGNOSIS — Z1231 Encounter for screening mammogram for malignant neoplasm of breast: Secondary | ICD-10-CM | POA: Insufficient documentation

## 2012-09-04 ENCOUNTER — Encounter: Payer: Self-pay | Admitting: Oncology

## 2012-09-04 ENCOUNTER — Telehealth: Payer: Self-pay | Admitting: Oncology

## 2012-09-04 ENCOUNTER — Other Ambulatory Visit: Payer: Medicare Other | Admitting: Lab

## 2012-09-04 ENCOUNTER — Encounter: Payer: Medicare Other | Admitting: Oncology

## 2012-09-04 NOTE — Progress Notes (Signed)
No show X2 for lab and visit. No show letter mailed to patient. This encounter was created in error - please disregard.

## 2012-09-04 NOTE — Telephone Encounter (Signed)
returned pt call and r/s appt...Marland KitchenMarland KitchenDone

## 2012-09-12 ENCOUNTER — Encounter: Payer: Self-pay | Admitting: Oncology

## 2012-09-12 ENCOUNTER — Other Ambulatory Visit (HOSPITAL_BASED_OUTPATIENT_CLINIC_OR_DEPARTMENT_OTHER): Payer: Medicare Other | Admitting: Lab

## 2012-09-12 ENCOUNTER — Ambulatory Visit (HOSPITAL_BASED_OUTPATIENT_CLINIC_OR_DEPARTMENT_OTHER): Payer: Medicare Other | Admitting: Oncology

## 2012-09-12 ENCOUNTER — Telehealth: Payer: Self-pay | Admitting: Oncology

## 2012-09-12 VITALS — BP 152/90 | HR 73 | Temp 97.5°F | Resp 18 | Ht 69.75 in | Wt 224.0 lb

## 2012-09-12 DIAGNOSIS — D72820 Lymphocytosis (symptomatic): Secondary | ICD-10-CM

## 2012-09-12 DIAGNOSIS — C9110 Chronic lymphocytic leukemia of B-cell type not having achieved remission: Secondary | ICD-10-CM

## 2012-09-12 HISTORY — DX: Chronic lymphocytic leukemia of B-cell type not having achieved remission: C91.10

## 2012-09-12 LAB — CBC WITH DIFFERENTIAL/PLATELET
BASO%: 0.3 % (ref 0.0–2.0)
EOS%: 0.9 % (ref 0.0–7.0)
HCT: 42.2 % (ref 34.8–46.6)
LYMPH%: 69.7 % — ABNORMAL HIGH (ref 14.0–49.7)
MCH: 29.1 pg (ref 25.1–34.0)
MCHC: 33.3 g/dL (ref 31.5–36.0)
MONO%: 3.8 % (ref 0.0–14.0)
NEUT%: 25.3 % — ABNORMAL LOW (ref 38.4–76.8)
Platelets: 273 10*3/uL (ref 145–400)
RBC: 4.82 10*6/uL (ref 3.70–5.45)
WBC: 16.6 10*3/uL — ABNORMAL HIGH (ref 3.9–10.3)

## 2012-09-12 LAB — COMPREHENSIVE METABOLIC PANEL (CC13)
ALT: 18 U/L (ref 0–55)
AST: 18 U/L (ref 5–34)
Alkaline Phosphatase: 84 U/L (ref 40–150)
Creatinine: 0.7 mg/dL (ref 0.6–1.1)
Sodium: 140 mEq/L (ref 136–145)
Total Bilirubin: 0.77 mg/dL (ref 0.20–1.20)
Total Protein: 7.1 g/dL (ref 6.4–8.3)

## 2012-09-12 LAB — TECHNOLOGIST REVIEW

## 2012-09-12 NOTE — Telephone Encounter (Signed)
Gave pt appt for lab and MD for March  2015 °

## 2012-09-12 NOTE — Progress Notes (Signed)
Hematology and Oncology Follow Up Visit  [name] [id number] 1941-04-08 72 y.o. 09/12/2012 1:36 PM   Principle Diagnosis: 72 year old with lymphocytosis with the likely diagnosis of stage 0 CLL in 07/2011.   Current therapy: Observation and surveillance.   Interim History:  [name] presents for a follow up visit. She is 72 year old nice initially seen in 07/2011 for the evaluation for lymphocytosis. Her flow cytometry did show likely CLL. She is currently asymptomatic and have been doing well since her last visit. She had not reported any fevers, did not report any chills, did not report any dysphagia, did not report no lymphadenopathy, did not report any real decline in her energy or performance status.   Medications: I have reviewed the patient's current medications. Current outpatient prescriptions:cetirizine (ZYRTEC) 10 MG chewable tablet, Chew 10 mg by mouth daily. As needed for allergies , Disp: , Rfl: ;  fish oil-omega-3 fatty acids 1000 MG capsule, Take 2 g by mouth daily.  , Disp: , Rfl: ;  glucosamine-chondroitin 500-400 MG tablet, Take 1 tablet by mouth daily.  , Disp: , Rfl: ;  ibuprofen (ADVIL,MOTRIN) 200 MG tablet, Take 200 mg by mouth every 6 (six) hours as needed.  , Disp: , Rfl:  Multiple Vitamin (MULTIVITAMIN) capsule, Take 1 capsule by mouth daily.  , Disp: , Rfl:   Allergies:  Allergies  Allergen Reactions  . Bee Venom   . Penicillins     Past Medical History, Surgical history, Social history, and Family History were reviewed and updated.  Review of Systems: Constitutional:  Negative for fever, chills, night sweats, anorexia, weight loss, pain. Cardiovascular: no chest pain or dyspnea on exertion Respiratory: negative Neurological: no TIA or stroke symptoms Dermatological: negative ENT: negative Skin: Negative. Gastrointestinal: no abdominal pain, change in bowel habits, or black or bloody stools Genito-Urinary: negative Hematological and Lymphatic:  negative Breast: negative Musculoskeletal: negative Remaining ROS negative.  Physical Exam: Blood pressure 152/90, pulse 73, temperature 97.5 F (36.4 C), temperature source Oral, resp. rate 18, height 5' 9.75" (1.772 m), weight 224 lb (101.606 kg). ECOG: 0 General appearance: alert Head: Normocephalic, without obvious abnormality, atraumatic Neck: no adenopathy, no carotid bruit, no JVD, supple, symmetrical, trachea midline and thyroid not enlarged, symmetric, no tenderness/mass/nodules Lymph nodes: Cervical, supraclavicular, and axillary nodes normal. Heart:regular rate and rhythm, S1, S2 normal, no murmur, click, rub or gallop Lung:chest clear, no wheezing, rales, normal symmetric air entry Abdomen: soft, non-tender, without masses or organomegaly EXT:no erythema, induration, or nodules   Lab Results: Lab Results  Component Value Date   WBC 16.6* 09/12/2012   HGB 14.1 09/12/2012   HCT 42.2 09/12/2012   MCV 87.5 09/12/2012   PLT 273 09/12/2012     Chemistry      Component Value Date/Time   NA 139 02/01/2012 1519   K 3.7 02/01/2012 1519   CL 103 02/01/2012 1519   CO2 24 02/01/2012 1519   BUN 18 02/01/2012 1519   CREATININE 0.64 02/01/2012 1519      Component Value Date/Time   CALCIUM 9.5 02/01/2012 1519   ALKPHOS 82 02/01/2012 1519   AST 19 02/01/2012 1519   ALT 15 02/01/2012 1519   BILITOT 0.6 02/01/2012 1519      Impression and Plan:  This is a pleasant 72 year old woman with Lymphocytosis. The differential diagnosis at this time includes a monoclonal lymphocytosis, versus CLL. Her flow cytometry indicate possible stage 0 CLL. She is asymptomatic at this point and requires no  treatment.. I would suggest just observation and watch and wait approach.The odds are she will probably not develop any symptoms for years to come. However, I would like to continue to monitor her. I will repeat her laboratory data in 1 year per her request, and imaging studies as such as CT scan of the  chest, abdomen and pelvis as needed.  I continued to educated her about possible consitutional  symptoms (fever, chills, sweats, etc.), infection, and possible cytopenias that could develop over a period to time.    [place], [place] 3/12/20141:36 PM

## 2013-08-01 IMAGING — MG MM DIGITAL SCREENING BILAT
5 series · 5 of 5 positions shown · non-contrast
Comparison: 03/31/2006

CLINICAL DATA: Screening.

DIGITAL BILATERAL SCREENING MAMMOGRAM WITH CAD

[R CC]
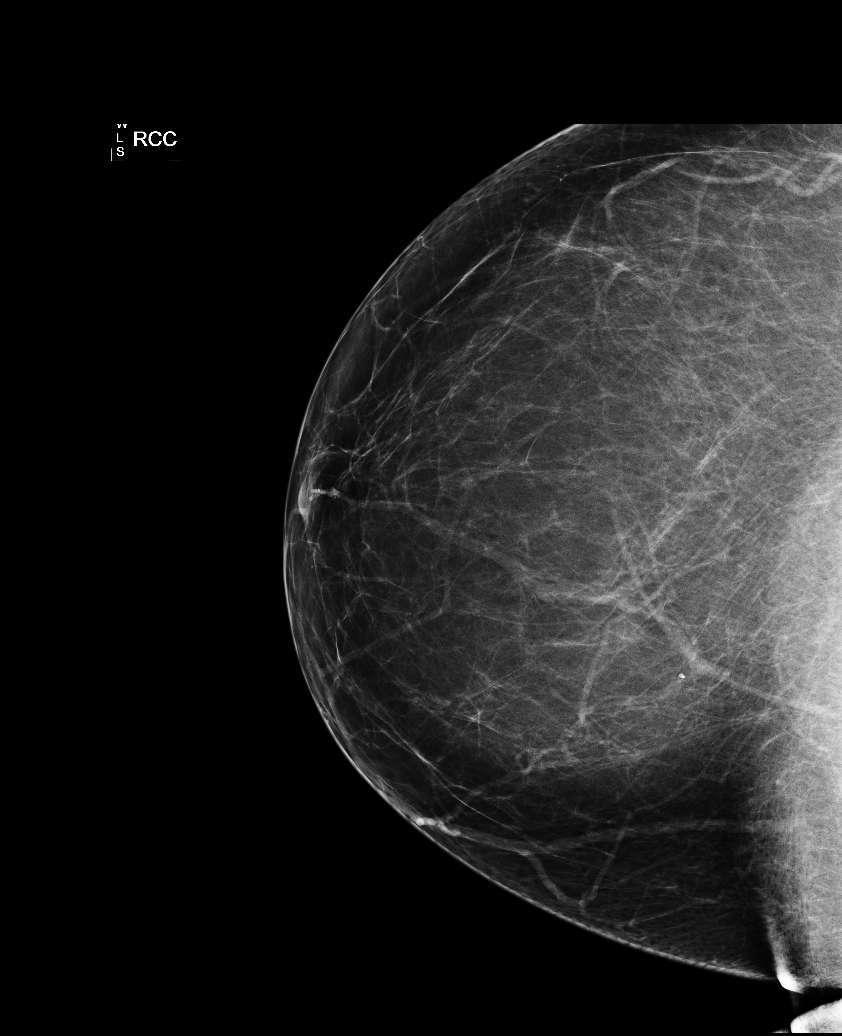

[R MLO (1 of 2)]
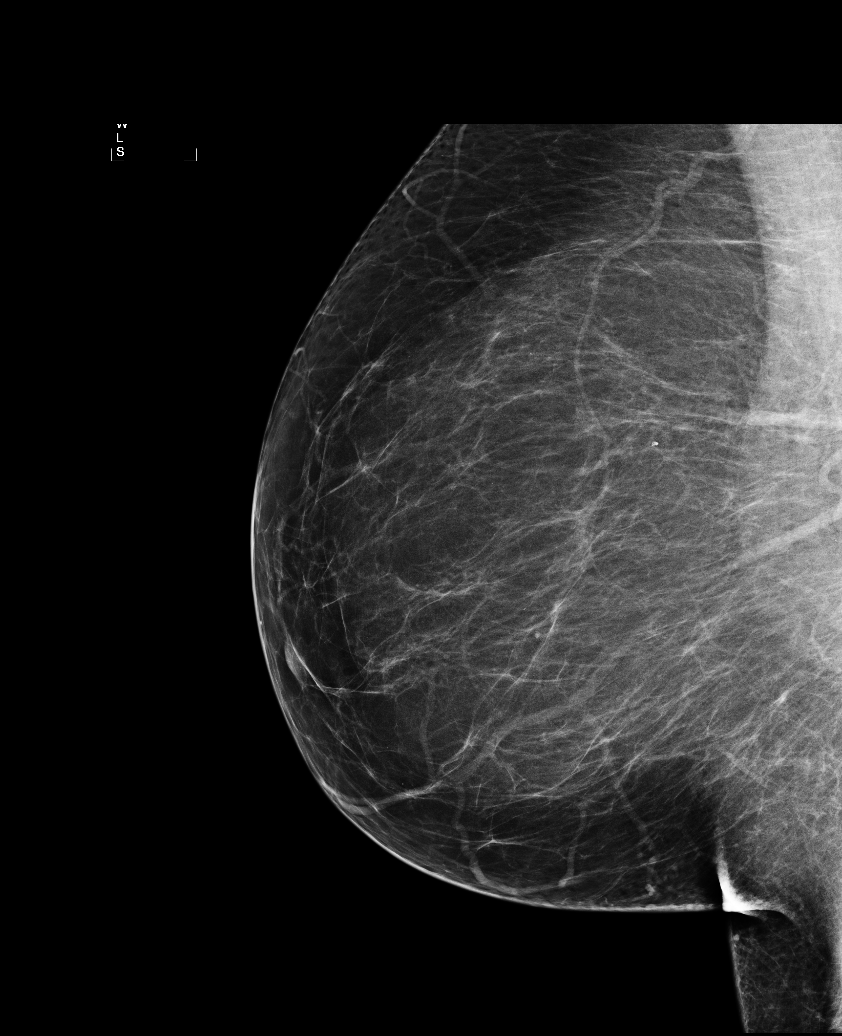

[L CC]
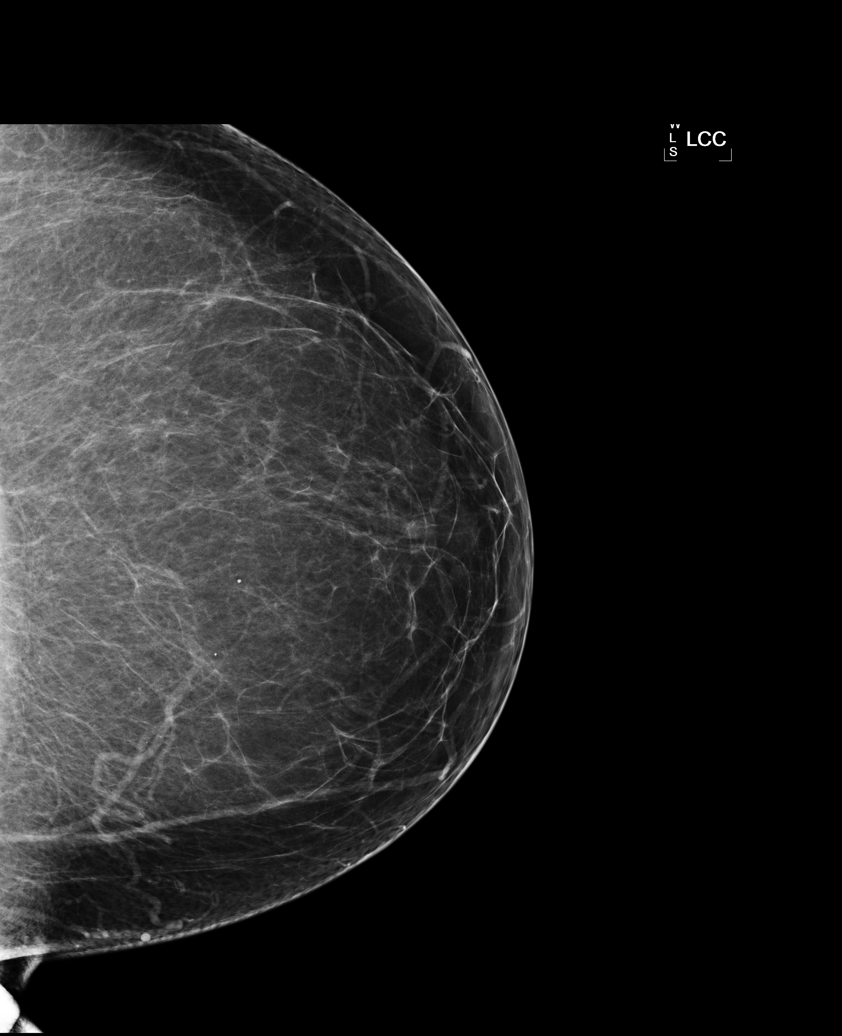

[L MLO]
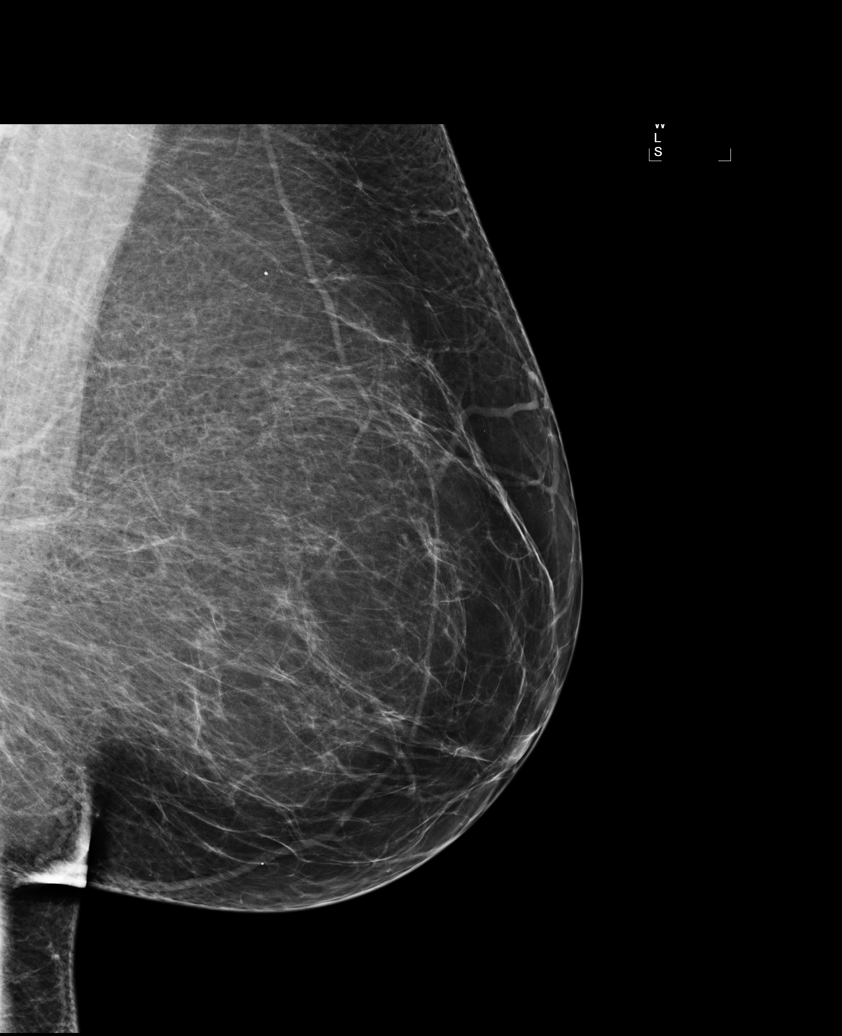

[R MLO (2 of 2)]
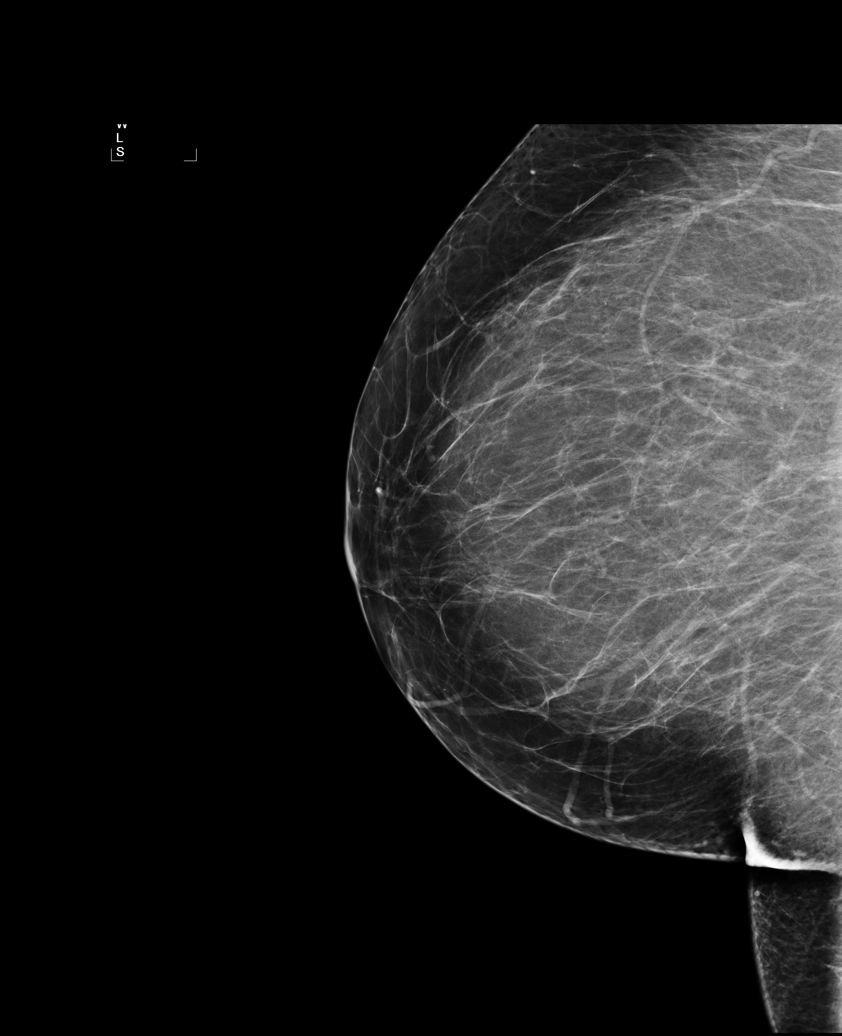

[5 of 5 positions shown; findings below may reference images not displayed]

FINDINGS: ACR Breast Density Category 1: The breast tissue is almost entirely
fatty.

There is no suspicious dominant mass, architectural distortion, or
calcification to suggest malignancy.

Images were processed with CAD.
IMPRESSION: No mammographic evidence of malignancy.

A result letter of this screening mammogram will be mailed directly
to the patient.

RECOMMENDATION:
Screening mammogram in one year. (Code:5F-I-V0X)

BI-RADS CATEGORY 1:  Negative.

## 2013-08-16 ENCOUNTER — Other Ambulatory Visit (HOSPITAL_COMMUNITY): Payer: Self-pay | Admitting: Internal Medicine

## 2013-08-16 DIAGNOSIS — Z1231 Encounter for screening mammogram for malignant neoplasm of breast: Secondary | ICD-10-CM

## 2013-08-29 ENCOUNTER — Ambulatory Visit (HOSPITAL_COMMUNITY): Payer: Medicare Other

## 2013-09-05 ENCOUNTER — Ambulatory Visit (HOSPITAL_COMMUNITY)
Admission: RE | Admit: 2013-09-05 | Discharge: 2013-09-05 | Disposition: A | Discharge status: Home / Self Care | Payer: Medicare Other | Source: Ambulatory Visit | Attending: Internal Medicine | Admitting: Internal Medicine

## 2013-09-05 DIAGNOSIS — Z1231 Encounter for screening mammogram for malignant neoplasm of breast: Secondary | ICD-10-CM

## 2013-09-11 ENCOUNTER — Telehealth: Payer: Self-pay | Admitting: Oncology

## 2013-09-11 NOTE — Telephone Encounter (Signed)
returned pt call and advised to call back to r/s sched appt did not cx appt yet

## 2013-09-11 NOTE — Telephone Encounter (Signed)
pt called back to r/s appt done....pt aware of new d.t

## 2013-09-12 ENCOUNTER — Other Ambulatory Visit: Payer: Medicare Other

## 2013-09-12 ENCOUNTER — Ambulatory Visit: Payer: Medicare Other | Admitting: Oncology

## 2013-09-27 ENCOUNTER — Telehealth: Payer: Self-pay | Admitting: Oncology

## 2013-09-27 NOTE — Telephone Encounter (Signed)
per MD cld to adv pt that appt had been adj/adv new appt time is 10/25/13 @ 12:45 & 1:15. Will mail sch as well to address on acct

## 2013-10-24 ENCOUNTER — Other Ambulatory Visit: Payer: Self-pay | Admitting: Oncology

## 2013-10-24 DIAGNOSIS — C9110 Chronic lymphocytic leukemia of B-cell type not having achieved remission: Secondary | ICD-10-CM

## 2013-10-25 ENCOUNTER — Telehealth: Payer: Self-pay | Admitting: Oncology

## 2013-10-25 ENCOUNTER — Other Ambulatory Visit: Payer: Medicare Other

## 2013-10-25 ENCOUNTER — Ambulatory Visit: Payer: Medicare Other | Admitting: Oncology

## 2013-10-25 ENCOUNTER — Encounter: Payer: Self-pay | Admitting: Oncology

## 2013-10-25 ENCOUNTER — Other Ambulatory Visit (HOSPITAL_BASED_OUTPATIENT_CLINIC_OR_DEPARTMENT_OTHER): Payer: Medicare Other

## 2013-10-25 ENCOUNTER — Ambulatory Visit (HOSPITAL_BASED_OUTPATIENT_CLINIC_OR_DEPARTMENT_OTHER): Payer: Medicare Other | Admitting: Oncology

## 2013-10-25 VITALS — BP 166/90 | HR 68 | Temp 98.1°F | Resp 18 | Ht 69.75 in | Wt 221.4 lb

## 2013-10-25 DIAGNOSIS — D72820 Lymphocytosis (symptomatic): Secondary | ICD-10-CM

## 2013-10-25 DIAGNOSIS — C9110 Chronic lymphocytic leukemia of B-cell type not having achieved remission: Secondary | ICD-10-CM

## 2013-10-25 LAB — CBC WITH DIFFERENTIAL/PLATELET
BASO%: 0.3 % (ref 0.0–2.0)
Basophils Absolute: 0.1 10*3/uL (ref 0.0–0.1)
EOS ABS: 0.2 10*3/uL (ref 0.0–0.5)
EOS%: 1.3 % (ref 0.0–7.0)
HCT: 44 % (ref 34.8–46.6)
HGB: 14.3 g/dL (ref 11.6–15.9)
LYMPH%: 65.5 % — ABNORMAL HIGH (ref 14.0–49.7)
MCH: 28.9 pg (ref 25.1–34.0)
MCHC: 32.6 g/dL (ref 31.5–36.0)
MCV: 88.6 fL (ref 79.5–101.0)
MONO#: 0.6 10*3/uL (ref 0.1–0.9)
MONO%: 3.7 % (ref 0.0–14.0)
NEUT%: 29.2 % — ABNORMAL LOW (ref 38.4–76.8)
NEUTROS ABS: 5 10*3/uL (ref 1.5–6.5)
Platelets: 253 10*3/uL (ref 145–400)
RBC: 4.97 10*6/uL (ref 3.70–5.45)
RDW: 14.1 % (ref 11.2–14.5)
WBC: 17 10*3/uL — AB (ref 3.9–10.3)
lymph#: 11.1 10*3/uL — ABNORMAL HIGH (ref 0.9–3.3)

## 2013-10-25 LAB — COMPREHENSIVE METABOLIC PANEL (CC13)
ALBUMIN: 4.2 g/dL (ref 3.5–5.0)
ALK PHOS: 74 U/L (ref 40–150)
ALT: 17 U/L (ref 0–55)
AST: 22 U/L (ref 5–34)
Anion Gap: 10 mEq/L (ref 3–11)
BUN: 17.2 mg/dL (ref 7.0–26.0)
CO2: 23 meq/L (ref 22–29)
Calcium: 9.6 mg/dL (ref 8.4–10.4)
Chloride: 107 mEq/L (ref 98–109)
Creatinine: 0.7 mg/dL (ref 0.6–1.1)
GLUCOSE: 97 mg/dL (ref 70–140)
POTASSIUM: 4.4 meq/L (ref 3.5–5.1)
SODIUM: 140 meq/L (ref 136–145)
TOTAL PROTEIN: 6.9 g/dL (ref 6.4–8.3)
Total Bilirubin: 0.89 mg/dL (ref 0.20–1.20)

## 2013-10-25 LAB — TECHNOLOGIST REVIEW

## 2013-10-25 NOTE — Addendum Note (Signed)
Addended by: Randolm Idol on: 10/25/2013 01:58 PM   Modules accepted: Orders

## 2013-10-25 NOTE — Progress Notes (Signed)
Hematology and Oncology Follow Up Visit  [name] [id number] 1941-03-07 73 y.o. 10/25/2013 1:13 PM   Principle Diagnosis: 73 year old with lymphocytosis with the likely diagnosis of stage 0 CLL in 07/2011.   Current therapy: Observation and surveillance.   Interim History:  [name] presents for a follow up visit. She is 73 year old woman with lymphocytosis and her flow cytometry did show likely CLL. She is currently asymptomatic and have been doing well since her last visit. She had not reported any fevers, did not report any chills, did not report any dysphagia, did not report no lymphadenopathy, did not report any real decline in her energy or performance status. Is not reported any constitutional symptoms of weight loss or appetite changes. Has not reported any easy bruisability or petechiae. Has not reported any abdominal pain or satiety. Has not reported any change in her bowel habits or genitourinary complaints. Has not reported any neurological symptoms.   Medications: I have reviewed the patient's current medications.   Current Outpatient Prescriptions  Medication Sig Dispense Refill  . cetirizine (ZYRTEC) 10 MG chewable tablet Chew 10 mg by mouth daily. As needed for allergies       . fish oil-omega-3 fatty acids 1000 MG capsule Take 2 g by mouth daily.        Marland Kitchen glucosamine-chondroitin 500-400 MG tablet Take 1 tablet by mouth daily.        Marland Kitchen ibuprofen (ADVIL,MOTRIN) 200 MG tablet Take 200 mg by mouth every 6 (six) hours as needed.        . Multiple Vitamin (MULTIVITAMIN) capsule Take 1 capsule by mouth daily.         No current facility-administered medications for this visit.    Allergies:  Allergies  Allergen Reactions  . Bee Venom   . Penicillins     Past Medical History, Surgical history, Social history, and Family History were reviewed and updated.  Review of Systems: Constitutional:  Negative for fever, chills, night sweats, anorexia, weight loss,  pain. Cardiovascular: no chest pain or dyspnea on exertion Neurological: no TIA or stroke symptoms Gastrointestinal: no abdominal pain, change in bowel habits, or black or bloody stools Remaining ROS negative.  Physical Exam: Blood pressure 166/90, pulse 68, temperature 98.1 F (36.7 C), temperature source Oral, resp. rate 18, height 5' 9.75" (1.772 m), weight 221 lb 6.4 oz (100.426 kg). ECOG: 0 General appearance: alert awake not in any distress. Head: Normocephalic, without obvious abnormality, atraumatic Neck: no adenopathy, no carotid bruit, no JVD, supple, symmetrical, trachea midline and thyroid not enlarged, symmetric, no tenderness/mass/nodules Lymph nodes: Cervical, supraclavicular, and axillary nodes normal. Heart:regular rate and rhythm, S1, S2 normal, no murmur, click, rub or gallop Lung:chest clear, no wheezing, rales, normal symmetric air entry Abdomen: soft, non-tender, without masses or organomegaly EXT:no erythema, induration, or nodules   Lab Results: Lab Results  Component Value Date   WBC 17.0* 10/25/2013   HGB 14.3 10/25/2013   HCT 44.0 10/25/2013   MCV 88.6 10/25/2013   PLT 253 10/25/2013     Chemistry      Component Value Date/Time   NA 140 09/12/2012 1258   NA 139 02/01/2012 1519   K 4.2 09/12/2012 1258   K 3.7 02/01/2012 1519   CL 105 09/12/2012 1258   CL 103 02/01/2012 1519   CO2 25 09/12/2012 1258   CO2 24 02/01/2012 1519   BUN 17.8 09/12/2012 1258   BUN 18 02/01/2012 1519   CREATININE 0.7 09/12/2012  1258   CREATININE 0.64 02/01/2012 1519      Component Value Date/Time   CALCIUM 9.9 09/12/2012 1258   CALCIUM 9.5 02/01/2012 1519   ALKPHOS 84 09/12/2012 1258   ALKPHOS 82 02/01/2012 1519   AST 18 09/12/2012 1258   AST 19 02/01/2012 1519   ALT 18 09/12/2012 1258   ALT 15 02/01/2012 1519   BILITOT 0.77 09/12/2012 1258   BILITOT 0.6 02/01/2012 1519      Impression and Plan:  This is a pleasant 73 year old woman with Lymphocytosis. The differential diagnosis at  this time includes a monoclonal lymphocytosis, versus CLL. Her flow cytometry indicate possible stage 0 CLL. She is asymptomatic at this point and requires no treatment. Her laboratory data reviewed today and showed stable white cell count on lymphocyte count. I would suggest just observation and watch and wait approach. I will repeat her laboratory data in 1 year per her request, and imaging studies as such as CT scan of the chest, abdomen and pelvis as needed.  I continued to educated her about possible consitutional  symptoms (fever, chills, sweats, etc.), infection, and possible cytopenias that could develop over a period to time.    Wyatt Portela 4/24/20151:13 PM

## 2013-10-25 NOTE — Telephone Encounter (Signed)
lvm for pt regarding to April 2016 appt....mailed pt appt sched/avs and letter °

## 2014-05-19 DIAGNOSIS — R011 Cardiac murmur, unspecified: Secondary | ICD-10-CM | POA: Insufficient documentation

## 2014-05-19 DIAGNOSIS — Z91030 Bee allergy status: Secondary | ICD-10-CM | POA: Insufficient documentation

## 2014-09-30 ENCOUNTER — Other Ambulatory Visit (HOSPITAL_COMMUNITY): Payer: Self-pay | Admitting: Internal Medicine

## 2014-09-30 DIAGNOSIS — Z1231 Encounter for screening mammogram for malignant neoplasm of breast: Secondary | ICD-10-CM

## 2014-10-07 ENCOUNTER — Ambulatory Visit (HOSPITAL_COMMUNITY)
Admission: RE | Admit: 2014-10-07 | Discharge: 2014-10-07 | Disposition: A | Discharge status: Home / Self Care | Payer: Medicare Other | Source: Ambulatory Visit | Attending: Internal Medicine | Admitting: Internal Medicine

## 2014-10-07 DIAGNOSIS — Z1231 Encounter for screening mammogram for malignant neoplasm of breast: Secondary | ICD-10-CM | POA: Diagnosis present

## 2014-10-23 ENCOUNTER — Telehealth: Payer: Self-pay | Admitting: Oncology

## 2014-10-23 NOTE — Telephone Encounter (Signed)
s.w. pt and cx appt she is out of town...she will call to r/s

## 2014-10-24 ENCOUNTER — Ambulatory Visit: Payer: Medicare Other | Admitting: Oncology

## 2014-10-24 ENCOUNTER — Other Ambulatory Visit: Payer: Medicare Other

## 2015-04-24 ENCOUNTER — Telehealth: Payer: Self-pay | Admitting: Oncology

## 2015-04-24 NOTE — Telephone Encounter (Signed)
Returned call re appointment. R/s missed appointments from April 2016. Gave patient new appointments for 12/6.

## 2015-05-21 DIAGNOSIS — Z0000 Encounter for general adult medical examination without abnormal findings: Secondary | ICD-10-CM | POA: Insufficient documentation

## 2015-06-09 ENCOUNTER — Other Ambulatory Visit (HOSPITAL_BASED_OUTPATIENT_CLINIC_OR_DEPARTMENT_OTHER): Payer: Medicare Other

## 2015-06-09 ENCOUNTER — Telehealth: Payer: Self-pay | Admitting: Oncology

## 2015-06-09 ENCOUNTER — Ambulatory Visit (HOSPITAL_BASED_OUTPATIENT_CLINIC_OR_DEPARTMENT_OTHER): Payer: Medicare Other | Admitting: Oncology

## 2015-06-09 VITALS — BP 150/75 | HR 65 | Temp 98.0°F | Resp 18 | Ht 69.75 in | Wt 212.1 lb

## 2015-06-09 DIAGNOSIS — C9110 Chronic lymphocytic leukemia of B-cell type not having achieved remission: Secondary | ICD-10-CM

## 2015-06-09 DIAGNOSIS — D72820 Lymphocytosis (symptomatic): Secondary | ICD-10-CM

## 2015-06-09 LAB — COMPREHENSIVE METABOLIC PANEL
ALT: 17 U/L (ref 0–55)
ANION GAP: 12 meq/L — AB (ref 3–11)
AST: 19 U/L (ref 5–34)
Albumin: 4.4 g/dL (ref 3.5–5.0)
Alkaline Phosphatase: 84 U/L (ref 40–150)
BILIRUBIN TOTAL: 1.46 mg/dL — AB (ref 0.20–1.20)
BUN: 12.8 mg/dL (ref 7.0–26.0)
CO2: 24 meq/L (ref 22–29)
Calcium: 10 mg/dL (ref 8.4–10.4)
Chloride: 103 mEq/L (ref 98–109)
Creatinine: 0.8 mg/dL (ref 0.6–1.1)
EGFR: 70 mL/min/{1.73_m2} — AB (ref >90)
Glucose: 96 mg/dl (ref 70–140)
POTASSIUM: 4.3 meq/L (ref 3.5–5.1)
Sodium: 139 mEq/L (ref 136–145)
TOTAL PROTEIN: 7.7 g/dL (ref 6.4–8.3)

## 2015-06-09 LAB — CBC WITH DIFFERENTIAL/PLATELET
BASO%: 0.2 % (ref 0.0–2.0)
BASOS ABS: 0 10*3/uL (ref 0.0–0.1)
EOS ABS: 0.1 10*3/uL (ref 0.0–0.5)
EOS%: 0.6 % (ref 0.0–7.0)
HCT: 46.7 % — ABNORMAL HIGH (ref 34.8–46.6)
HGB: 15.4 g/dL (ref 11.6–15.9)
LYMPH%: 54.7 % — AB (ref 14.0–49.7)
MCH: 29.3 pg (ref 25.1–34.0)
MCHC: 33 g/dL (ref 31.5–36.0)
MCV: 88.9 fL (ref 79.5–101.0)
MONO#: 0.8 10*3/uL (ref 0.1–0.9)
MONO%: 4.2 % (ref 0.0–14.0)
NEUT%: 40.3 % (ref 38.4–76.8)
NEUTROS ABS: 7.5 10*3/uL — AB (ref 1.5–6.5)
PLATELETS: 284 10*3/uL (ref 145–400)
RBC: 5.25 10*6/uL (ref 3.70–5.45)
RDW: 14.3 % (ref 11.2–14.5)
WBC: 18.5 10*3/uL — ABNORMAL HIGH (ref 3.9–10.3)
lymph#: 10.1 10*3/uL — ABNORMAL HIGH (ref 0.9–3.3)

## 2015-06-09 LAB — TECHNOLOGIST REVIEW

## 2015-06-09 NOTE — Telephone Encounter (Signed)
per pof to sch pt appt-gave pt copy of avs °

## 2015-06-09 NOTE — Progress Notes (Signed)
Hematology and Oncology Follow Up Visit  KAWTHAR OSUCH [id number] 1941/05/21 74 y.o. 06/09/2015 12:30 PM   Principle Diagnosis: 74 year old with lymphocytosis with the diagnosis of stage 0 CLL in 07/2011.   Current therapy: Observation and surveillance.   Interim History:  Mrs. [name] presents for a follow up visit. Since the last visit, she reports no major complaints. She did have a root canal and a dental abscess and currently on antibiotics. She had not reported any fevers, did not report any chills, did not report any dysphagia, did not report no lymphadenopathy, did not report any real decline in her energy or performance status. Is not reported any constitutional symptoms of weight loss or appetite changes. She continues to be independent and performs activities of daily living. Able to drive and take care of her own affairs.   She does not report any headaches, blurry vision, syncope or seizures. Does not report any chest pain, palpitation orthopnea. Does not report any cough or hemoptysis. Does not report any nausea, vomiting or hematochezia. Has not reported any easy bruisability or petechiae. Has not reported any abdominal pain or satiety. Has not reported any change in her bowel habits or genitourinary complaints. Has not reported any neurological symptoms.   Medications: I have reviewed the patient's current medications.   Current Outpatient Prescriptions  Medication Sig Dispense Refill  . cetirizine (ZYRTEC) 10 MG chewable tablet Chew 10 mg by mouth daily. As needed for allergies     . clindamycin (CLEOCIN) 300 MG capsule Take 300 mg by mouth 3 (three) times daily.    . Fish Oil-Krill Oil (KRILL OIL PLUS PO) Take 1 tablet by mouth daily.    Marland Kitchen glucosamine-chondroitin 500-400 MG tablet Take 1 tablet by mouth daily.      Marland Kitchen HYDROcodone-acetaminophen (NORCO/VICODIN) 5-325 MG tablet Take 1 tablet by mouth every 6 (six) hours as needed for moderate pain.    Marland Kitchen ibuprofen (ADVIL,MOTRIN)  200 MG tablet Take 200 mg by mouth every 6 (six) hours as needed.      . Multiple Vitamin (MULTIVITAMIN) capsule Take 1 capsule by mouth daily.       No current facility-administered medications for this visit.    Allergies:  Allergies  Allergen Reactions  . Bee Venom   . Penicillins     Past Medical History, Surgical history, Social history, and Family History were reviewed and updated.   Physical Exam: Blood pressure 150/75, pulse 65, temperature 98 F (36.7 C), temperature source Oral, resp. rate 18, height 5' 9.75" (1.772 m), weight 212 lb 1.6 oz (96.208 kg), SpO2 97 %. ECOG: 0 General appearance: alert awake n pleasant woman without distress. Head: Normocephalic, without obvious abnormality no oral ulcers or lesions. Neck: no adenopathy Lymph nodes: Cervical, supraclavicular, and axillary nodes normal. Heart:regular rate and rhythm, S1, S2 normal, no murmur, click, rub or gallop Lung:chest clear, no wheezing, rales, normal symmetric air entry Abdomen: soft, non-tender, without masses or organomegaly shifting dullness or ascites. EXT:no erythema, induration, or nodules   Lab Results: Lab Results  Component Value Date   WBC 18.5* 06/09/2015   HGB 15.4 06/09/2015   HCT 46.7* 06/09/2015   MCV 88.9 06/09/2015   PLT 284 06/09/2015     Chemistry      Component Value Date/Time   NA 139 06/09/2015 1118   NA 139 02/01/2012 1519   K 4.3 06/09/2015 1118   K 3.7 02/01/2012 1519   CL 105 09/12/2012 1258   CL 103 02/01/2012  1519   CO2 24 06/09/2015 1118   CO2 24 02/01/2012 1519   BUN 12.8 06/09/2015 1118   BUN 18 02/01/2012 1519   CREATININE 0.8 06/09/2015 1118   CREATININE 0.64 02/01/2012 1519      Component Value Date/Time   CALCIUM 10.0 06/09/2015 1118   CALCIUM 9.5 02/01/2012 1519   ALKPHOS 84 06/09/2015 1118   ALKPHOS 82 02/01/2012 1519   AST 19 06/09/2015 1118   AST 19 02/01/2012 1519   ALT 17 06/09/2015 1118   ALT 15 02/01/2012 1519   BILITOT 1.46*  06/09/2015 1118   BILITOT 0.6 02/01/2012 1519      Impression and Plan:  74 year old woman with:  1.  Lymphocytosis. The differential diagnosis at this time includes a monoclonal lymphocytosis, versus CLL. Her flow cytometry indicate possible stage 0 CLL.   She is continues to be asymptomatic at this point and requires no treatment. Her laboratory data reviewed today and showed stable white cell count on lymphocyte count.  The plan is to continue with observation and surveillance on an annual basis. We can certainly institute therapy upon rapid progression of her disease which was discussed today. Symptoms of progression of disease would include rapid elevation in her white cell count, symptomatic lymphadenopathy, cytopenias or constitutional symptoms. At this point, she has none of these complaints.  2. Follow-up: Will be yearly sooner if she develops any issues.   [id number] 12/6/201612:30 PM

## 2015-10-01 DIAGNOSIS — K5900 Constipation, unspecified: Secondary | ICD-10-CM | POA: Insufficient documentation

## 2015-10-01 DIAGNOSIS — K921 Melena: Secondary | ICD-10-CM | POA: Insufficient documentation

## 2015-12-04 ENCOUNTER — Other Ambulatory Visit: Payer: Self-pay | Admitting: Internal Medicine

## 2015-12-04 DIAGNOSIS — Z1231 Encounter for screening mammogram for malignant neoplasm of breast: Secondary | ICD-10-CM

## 2015-12-16 ENCOUNTER — Ambulatory Visit
Admission: RE | Admit: 2015-12-16 | Discharge: 2015-12-16 | Disposition: A | Discharge status: Home / Self Care | Payer: Medicare Other | Source: Ambulatory Visit | Attending: Internal Medicine | Admitting: Internal Medicine

## 2015-12-16 DIAGNOSIS — Z1231 Encounter for screening mammogram for malignant neoplasm of breast: Secondary | ICD-10-CM

## 2016-02-24 ENCOUNTER — Encounter: Payer: Self-pay | Admitting: Gastroenterology

## 2016-06-02 DIAGNOSIS — I8390 Asymptomatic varicose veins of unspecified lower extremity: Secondary | ICD-10-CM | POA: Insufficient documentation

## 2016-06-02 DIAGNOSIS — M899 Disorder of bone, unspecified: Secondary | ICD-10-CM | POA: Insufficient documentation

## 2016-06-02 DIAGNOSIS — F4320 Adjustment disorder, unspecified: Secondary | ICD-10-CM | POA: Insufficient documentation

## 2016-06-08 ENCOUNTER — Other Ambulatory Visit (HOSPITAL_BASED_OUTPATIENT_CLINIC_OR_DEPARTMENT_OTHER): Payer: Medicare Other

## 2016-06-08 ENCOUNTER — Telehealth: Payer: Self-pay | Admitting: Oncology

## 2016-06-08 ENCOUNTER — Ambulatory Visit (HOSPITAL_BASED_OUTPATIENT_CLINIC_OR_DEPARTMENT_OTHER): Payer: Medicare Other | Admitting: Oncology

## 2016-06-08 VITALS — BP 145/86 | HR 65 | Temp 97.8°F | Resp 16 | Ht 69.75 in | Wt 209.0 lb

## 2016-06-08 DIAGNOSIS — C9110 Chronic lymphocytic leukemia of B-cell type not having achieved remission: Secondary | ICD-10-CM

## 2016-06-08 DIAGNOSIS — D72820 Lymphocytosis (symptomatic): Secondary | ICD-10-CM

## 2016-06-08 LAB — COMPREHENSIVE METABOLIC PANEL
ALK PHOS: 75 U/L (ref 40–150)
ALT: 16 U/L (ref 0–55)
ANION GAP: 9 meq/L (ref 3–11)
AST: 19 U/L (ref 5–34)
Albumin: 3.7 g/dL (ref 3.5–5.0)
BUN: 14.8 mg/dL (ref 7.0–26.0)
CO2: 26 meq/L (ref 22–29)
Calcium: 9.6 mg/dL (ref 8.4–10.4)
Chloride: 107 mEq/L (ref 98–109)
Creatinine: 0.7 mg/dL (ref 0.6–1.1)
EGFR: 85 mL/min/{1.73_m2} — AB (ref >90)
GLUCOSE: 72 mg/dL (ref 70–140)
POTASSIUM: 4.5 meq/L (ref 3.5–5.1)
SODIUM: 142 meq/L (ref 136–145)
Total Bilirubin: 0.87 mg/dL (ref 0.20–1.20)
Total Protein: 6.8 g/dL (ref 6.4–8.3)

## 2016-06-08 LAB — CBC WITH DIFFERENTIAL/PLATELET
BASO%: 0.3 % (ref 0.0–2.0)
BASOS ABS: 0 10*3/uL (ref 0.0–0.1)
EOS ABS: 0.2 10*3/uL (ref 0.0–0.5)
EOS%: 1 % (ref 0.0–7.0)
HCT: 42.7 % (ref 34.8–46.6)
HGB: 14.2 g/dL (ref 11.6–15.9)
LYMPH%: 70.8 % — AB (ref 14.0–49.7)
MCH: 29.2 pg (ref 25.1–34.0)
MCHC: 33.3 g/dL (ref 31.5–36.0)
MCV: 87.8 fL (ref 79.5–101.0)
MONO#: 0.6 10*3/uL (ref 0.1–0.9)
MONO%: 3.6 % (ref 0.0–14.0)
NEUT#: 3.8 10*3/uL (ref 1.5–6.5)
NEUT%: 24.3 % — AB (ref 38.4–76.8)
Platelets: 266 10*3/uL (ref 145–400)
RBC: 4.87 10*6/uL (ref 3.70–5.45)
RDW: 15.9 % — ABNORMAL HIGH (ref 11.2–14.5)
WBC: 15.8 10*3/uL — ABNORMAL HIGH (ref 3.9–10.3)
lymph#: 11.2 10*3/uL — ABNORMAL HIGH (ref 0.9–3.3)

## 2016-06-08 LAB — TECHNOLOGIST REVIEW

## 2016-06-08 NOTE — Progress Notes (Signed)
Hematology and Oncology Follow Up Visit  [name] [id number] 12/04/1940 75 y.o. 06/08/2016 9:48 AM   Principle Diagnosis: 75 year old with lymphocytosis with the diagnosis of stage 0 CLL in 07/2011.   Current therapy: Observation and surveillance.   Interim History:  [name] presents for a follow up visit. Since the last visit, she reports no changes in her health. She remains fairly active and attends to activities of daily living. She had not reported any fevers, did not report any chills, did not report any dysphagia, did not report no lymphadenopathy, did not report any real decline in her energy or performance status. She is mourning the loss of her husband and currently grieving because of it. She denied any recent hospitalization or illnesses.  She does not report any headaches, blurry vision, syncope or seizures. Does not report any chest pain, palpitation orthopnea. Does not report any cough or hemoptysis. Does not report any nausea, vomiting or hematochezia. Has not reported any easy bruisability or petechiae. Has not reported any abdominal pain or satiety. Has not reported any change in her bowel habits or genitourinary complaints. Has not reported any neurological symptoms.   Medications: I have reviewed the patient's current medications.   Current Outpatient Prescriptions  Medication Sig Dispense Refill  . cetirizine (ZYRTEC) 10 MG chewable tablet Chew 10 mg by mouth daily. As needed for allergies     . Fish Oil-Krill Oil (KRILL OIL PLUS PO) Take 1 tablet by mouth daily.    Marland Kitchen glucosamine-chondroitin 500-400 MG tablet Take 1 tablet by mouth daily.      Marland Kitchen ibuprofen (ADVIL,MOTRIN) 200 MG tablet Take 200 mg by mouth every 6 (six) hours as needed.      . Multiple Vitamin (MULTIVITAMIN) capsule Take 1 capsule by mouth daily.       No current facility-administered medications for this visit.     Allergies:  Allergies  Allergen Reactions  . Bee Venom   . Penicillins      Past Medical History, Surgical history, Social history, and Family History were reviewed and updated.   Physical Exam: Blood pressure (!) 145/86, pulse 65, temperature 97.8 F (36.6 C), temperature source Oral, resp. rate 16, height 5' 9.75" (1.772 m), weight 209 lb (94.8 kg), SpO2 98 %. ECOG: 0 General appearance: Well-appearing woman without distress. Head: Normocephalic, without obvious abnormality no oral thrush noted. Neck: no adenopathy Lymph nodes: Cervical, supraclavicular, and axillary nodes normal. Heart:regular rate and rhythm, S1, S2 normal, no murmur, click, rub or gallop Lung:chest clear, no wheezing, rales, normal symmetric air entry Abdomen: soft, non-tender, without masses or organomegaly. No rebound or guarding. EXT:no erythema, induration, or nodules   Lab Results: Lab Results  Component Value Date   WBC 15.8 (H) 06/08/2016   HGB 14.2 06/08/2016   HCT 42.7 06/08/2016   MCV 87.8 06/08/2016   PLT 266 06/08/2016     Chemistry      Component Value Date/Time   NA 139 06/09/2015 1118   K 4.3 06/09/2015 1118   CL 105 09/12/2012 1258   CO2 24 06/09/2015 1118   BUN 12.8 06/09/2015 1118   CREATININE 0.8 06/09/2015 1118      Component Value Date/Time   CALCIUM 10.0 06/09/2015 1118   ALKPHOS 84 06/09/2015 1118   AST 19 06/09/2015 1118   ALT 17 06/09/2015 1118   BILITOT 1.46 (H) 06/09/2015 1118      Impression and Plan:  75 year old woman with:  1.  Lymphocytosis. The differential diagnosis  at this time includes a monoclonal lymphocytosis, versus CLL. Her flow cytometry indicate possible stage 0 CLL.   Her laboratory data and physical examination today did not reveal any progression of her disease. She remains completely asymptomatic with actual slight decrease in her total white cell count.  The plan is to continue with observation and surveillance on an annual basis. I educated her about signs and symptoms of CLL progression which includes  constitutional symptoms, painful lymphadenopathy, no others. She is to report any of these symptoms between visits to occur.  2. Follow-up: In 12 months sooner if needed to.   [name] 12/6/20179:48 AM

## 2016-06-08 NOTE — Telephone Encounter (Signed)
Appointments scheduled per 12/6 LOS. Patient given AVS report and calendars with future scheduled appointments. °

## 2017-02-10 ENCOUNTER — Other Ambulatory Visit: Payer: Self-pay | Admitting: Internal Medicine

## 2017-02-10 DIAGNOSIS — Z1231 Encounter for screening mammogram for malignant neoplasm of breast: Secondary | ICD-10-CM

## 2017-02-22 ENCOUNTER — Ambulatory Visit
Admission: RE | Admit: 2017-02-22 | Discharge: 2017-02-22 | Disposition: A | Discharge status: Home / Self Care | Payer: Medicare Other | Source: Ambulatory Visit | Attending: Internal Medicine | Admitting: Internal Medicine

## 2017-02-22 DIAGNOSIS — Z1231 Encounter for screening mammogram for malignant neoplasm of breast: Secondary | ICD-10-CM

## 2017-05-04 DIAGNOSIS — H6590 Unspecified nonsuppurative otitis media, unspecified ear: Secondary | ICD-10-CM | POA: Insufficient documentation

## 2017-05-04 DIAGNOSIS — R059 Cough, unspecified: Secondary | ICD-10-CM | POA: Insufficient documentation

## 2017-06-08 ENCOUNTER — Other Ambulatory Visit: Payer: Self-pay

## 2017-06-08 ENCOUNTER — Ambulatory Visit: Payer: Self-pay | Admitting: Oncology

## 2017-09-05 ENCOUNTER — Telehealth: Payer: Self-pay | Admitting: Oncology

## 2017-09-05 NOTE — Telephone Encounter (Signed)
Patient called to reschedule her apppointment

## 2017-09-08 DIAGNOSIS — E7849 Other hyperlipidemia: Secondary | ICD-10-CM | POA: Diagnosis not present

## 2017-09-08 DIAGNOSIS — R82998 Other abnormal findings in urine: Secondary | ICD-10-CM | POA: Diagnosis not present

## 2017-09-08 DIAGNOSIS — M859 Disorder of bone density and structure, unspecified: Secondary | ICD-10-CM | POA: Diagnosis not present

## 2017-09-08 DIAGNOSIS — R7309 Other abnormal glucose: Secondary | ICD-10-CM | POA: Diagnosis not present

## 2017-09-15 DIAGNOSIS — F4320 Adjustment disorder, unspecified: Secondary | ICD-10-CM | POA: Diagnosis not present

## 2017-09-15 DIAGNOSIS — Z6829 Body mass index (BMI) 29.0-29.9, adult: Secondary | ICD-10-CM | POA: Diagnosis not present

## 2017-09-15 DIAGNOSIS — Z91030 Bee allergy status: Secondary | ICD-10-CM | POA: Diagnosis not present

## 2017-09-15 DIAGNOSIS — M859 Disorder of bone density and structure, unspecified: Secondary | ICD-10-CM | POA: Diagnosis not present

## 2017-09-15 DIAGNOSIS — Z0000 Encounter for general adult medical examination without abnormal findings: Secondary | ICD-10-CM | POA: Diagnosis not present

## 2017-09-15 DIAGNOSIS — R011 Cardiac murmur, unspecified: Secondary | ICD-10-CM | POA: Diagnosis not present

## 2017-09-15 DIAGNOSIS — Z23 Encounter for immunization: Secondary | ICD-10-CM | POA: Diagnosis not present

## 2017-09-15 DIAGNOSIS — I8390 Asymptomatic varicose veins of unspecified lower extremity: Secondary | ICD-10-CM | POA: Diagnosis not present

## 2017-09-15 DIAGNOSIS — R030 Elevated blood-pressure reading, without diagnosis of hypertension: Secondary | ICD-10-CM | POA: Diagnosis not present

## 2017-09-15 DIAGNOSIS — M1990 Unspecified osteoarthritis, unspecified site: Secondary | ICD-10-CM | POA: Diagnosis not present

## 2017-09-15 DIAGNOSIS — E559 Vitamin D deficiency, unspecified: Secondary | ICD-10-CM | POA: Insufficient documentation

## 2017-09-15 DIAGNOSIS — Z1389 Encounter for screening for other disorder: Secondary | ICD-10-CM | POA: Diagnosis not present

## 2017-09-15 DIAGNOSIS — N3289 Other specified disorders of bladder: Secondary | ICD-10-CM | POA: Diagnosis not present

## 2017-09-20 DIAGNOSIS — Z1212 Encounter for screening for malignant neoplasm of rectum: Secondary | ICD-10-CM | POA: Diagnosis not present

## 2017-09-22 ENCOUNTER — Ambulatory Visit: Payer: Self-pay | Admitting: Oncology

## 2017-09-22 ENCOUNTER — Other Ambulatory Visit: Payer: Self-pay

## 2017-09-29 ENCOUNTER — Inpatient Hospital Stay: Payer: PPO | Attending: Oncology

## 2017-09-29 ENCOUNTER — Telehealth: Payer: Self-pay | Admitting: Oncology

## 2017-09-29 ENCOUNTER — Inpatient Hospital Stay (HOSPITAL_BASED_OUTPATIENT_CLINIC_OR_DEPARTMENT_OTHER): Payer: PPO | Admitting: Oncology

## 2017-09-29 VITALS — BP 165/90 | HR 72 | Temp 97.9°F | Resp 18 | Ht 69.75 in | Wt 204.7 lb

## 2017-09-29 DIAGNOSIS — C9110 Chronic lymphocytic leukemia of B-cell type not having achieved remission: Secondary | ICD-10-CM

## 2017-09-29 DIAGNOSIS — C9111 Chronic lymphocytic leukemia of B-cell type in remission: Secondary | ICD-10-CM | POA: Diagnosis not present

## 2017-09-29 LAB — COMPREHENSIVE METABOLIC PANEL
ALT: 21 U/L (ref 0–55)
AST: 20 U/L (ref 5–34)
Albumin: 4 g/dL (ref 3.5–5.0)
Alkaline Phosphatase: 78 U/L (ref 40–150)
Anion gap: 10 (ref 3–11)
BUN: 14 mg/dL (ref 7–26)
CHLORIDE: 105 mmol/L (ref 98–109)
CO2: 27 mmol/L (ref 22–29)
CREATININE: 0.7 mg/dL (ref 0.60–1.10)
Calcium: 9.6 mg/dL (ref 8.4–10.4)
Glucose, Bld: 97 mg/dL (ref 70–140)
POTASSIUM: 4.1 mmol/L (ref 3.5–5.1)
Sodium: 142 mmol/L (ref 136–145)
TOTAL PROTEIN: 6.8 g/dL (ref 6.4–8.3)
Total Bilirubin: 0.8 mg/dL (ref 0.2–1.2)

## 2017-09-29 LAB — CBC WITH DIFFERENTIAL/PLATELET
BASOS ABS: 0 10*3/uL (ref 0.0–0.1)
Basophils Relative: 0 %
EOS ABS: 0.2 10*3/uL (ref 0.0–0.5)
EOS PCT: 2 %
HCT: 44 % (ref 34.8–46.6)
HEMOGLOBIN: 14.3 g/dL (ref 11.6–15.9)
LYMPHS ABS: 8.3 10*3/uL — AB (ref 0.9–3.3)
LYMPHS PCT: 65 %
MCH: 29.3 pg (ref 25.1–34.0)
MCHC: 32.5 g/dL (ref 31.5–36.0)
MCV: 90.2 fL (ref 79.5–101.0)
Monocytes Absolute: 0.6 10*3/uL (ref 0.1–0.9)
Monocytes Relative: 5 %
NEUTROS PCT: 28 %
Neutro Abs: 3.5 10*3/uL (ref 1.5–6.5)
PLATELETS: 232 10*3/uL (ref 145–400)
RBC: 4.88 MIL/uL (ref 3.70–5.45)
RDW: 15 % — ABNORMAL HIGH (ref 11.2–14.5)
WBC: 12.6 10*3/uL — AB (ref 3.9–10.3)

## 2017-09-29 NOTE — Telephone Encounter (Signed)
Appointments scheduled AVS/Calendar printed per 3/29 los °

## 2017-09-29 NOTE — Progress Notes (Signed)
Hematology and Oncology Follow Up Visit  [name] [id number] March 22, 1941 77 y.o. 09/29/2017 8:51 AM   Principle Diagnosis: 77 year old woman with stage 0 CLL diagnosed in 2013.  She presented with lymphocytosis and no evidence of lymphadenopathy or cytopenias.  Current therapy: Active surveillance.  Interim History:  [name] presents is here for a follow-up.  She reports no major changes in her health since the last visit.  She denies any hospitalizations or illnesses.  She denied any worsening fatigue or decline in her energy or performance status.  She continues to be independent and attends to activities of daily living.  She denies any worsening neuropathy, fevers, recurrent infections or other constitutional symptoms.  Appetite remain excellent.  She denies any painful adenopathy.  She does not report any headaches, blurry vision, syncope or seizures.  She denies any fevers, chills or sweats.  Does not report any chest pain, palpitation orthopnea. Does not report any cough or hemoptysis. Does not report any nausea, vomiting or hematochezia.  She denies any petechia or ecchymosis.  She does not report any skin rashes or lesions.  She denies any frequency urgency or hematuria.  She does not report any arthralgias or myalgias.  Remaining review of system is negative.   Medications: I have reviewed the patient's current medications.   Current Outpatient Medications  Medication Sig Dispense Refill  . cetirizine (ZYRTEC) 10 MG chewable tablet Chew 10 mg by mouth daily. As needed for allergies     . Fish Oil-Krill Oil (KRILL OIL PLUS PO) Take 1 tablet by mouth daily.    Marland Kitchen glucosamine-chondroitin 500-400 MG tablet Take 1 tablet by mouth daily.      Marland Kitchen ibuprofen (ADVIL,MOTRIN) 200 MG tablet Take 200 mg by mouth every 6 (six) hours as needed.      . Multiple Vitamin (MULTIVITAMIN) capsule Take 1 capsule by mouth daily.       No current facility-administered medications for this visit.      Allergies:  Allergies  Allergen Reactions  . Bee Venom   . Penicillins     Past Medical History, Surgical history, Social history, and Family History were reviewed and updated.   Physical Exam: Blood pressure (!) 165/90, pulse 72, temperature 97.9 F (36.6 C), temperature source Oral, resp. rate 18, height 5' 9.75" (1.772 m), weight 204 lb 11.2 oz (92.9 kg), SpO2 97 %. ECOG: 0 General appearance: Alert, awake woman appeared comfortable. Head: Atraumatic without abnormalities. Oropharynx without any sores or ulcers. Eyes: No scleral icterus. Lymph nodes: No lymphadenopathy in the cervical, axillary or supraclavicular areas. Heart: Regular rate and rhythm without any murmurs or gallops. Lung: Clear to auscultation without any wheezes or dullness to percussion. Abdomen: Soft, nontender without any rebound or guarding.  Good bowel sounds. Musculoskeletal: No joint deformity or effusion. Neurological: No deficits motor, sensory or deep tendon reflexes. Skin: No rashes or lesions.   Lab Results: Lab Results  Component Value Date   WBC 12.6 (H) 09/29/2017   HGB 14.3 09/29/2017   HCT 44.0 09/29/2017   MCV 90.2 09/29/2017   PLT 232 09/29/2017     Chemistry      Component Value Date/Time   NA 142 06/08/2016 0918   K 4.5 06/08/2016 0918   CL 105 09/12/2012 1258   CO2 26 06/08/2016 0918   BUN 14.8 06/08/2016 0918   CREATININE 0.7 06/08/2016 0918      Component Value Date/Time   CALCIUM 9.6 06/08/2016 0918   ALKPHOS 75 06/08/2016  7262   AST 19 06/08/2016 0918   ALT 16 06/08/2016 0918   BILITOT 0.87 06/08/2016 0918      Impression and Plan:  77 year old woman with:  1.  Stage 0 CLL diagnosed in 2013.  She presented with lymphocytosis without any lymphadenopathy.  She has been on active surveillance and she has been completely asymptomatic from these findings.  Her CBC was reviewed from today and her white cell count continues to be relatively stable and her  lymphocytosis has decreased over the years.  The natural course of this disease as well as indication for treatment were reviewed.  She continues to be asymptomatic without any evidence of recurrent infections, cytopenias or bulky adenopathy.  If any of these issues develop over the years, treatment may be needed.  For the time being, I have elected to proceed with active surveillance.  2.  Cytopenias: No evidence of autoimmune thrombocytopenia or anemia.  We will continue to monitor those annually.  3.  Immune deficiencies: No evidence of recurrent infections related to CLL.  4.  Age-appropriate cancer screening: She is up-to-date at this time including mammography and colorectal cancer screening.  5.  Follow-up: In 12 months.   15  minutes was spent with the patient face-to-face today.  More than 50% of time was dedicated to patient counseling, education and answering question regarding her diagnosis and future plan of care.   Zola Button 3/29/20198:51 AM

## 2017-10-09 DIAGNOSIS — Z1212 Encounter for screening for malignant neoplasm of rectum: Secondary | ICD-10-CM | POA: Diagnosis not present

## 2017-10-09 DIAGNOSIS — Z1211 Encounter for screening for malignant neoplasm of colon: Secondary | ICD-10-CM | POA: Diagnosis not present

## 2018-04-25 ENCOUNTER — Other Ambulatory Visit: Payer: Self-pay | Admitting: Internal Medicine

## 2018-04-25 DIAGNOSIS — Z1231 Encounter for screening mammogram for malignant neoplasm of breast: Secondary | ICD-10-CM

## 2018-06-05 ENCOUNTER — Ambulatory Visit: Payer: PPO

## 2018-06-06 ENCOUNTER — Ambulatory Visit
Admission: RE | Admit: 2018-06-06 | Discharge: 2018-06-06 | Disposition: A | Discharge status: Home / Self Care | Payer: PPO | Source: Ambulatory Visit | Attending: Internal Medicine | Admitting: Internal Medicine

## 2018-06-06 DIAGNOSIS — Z1231 Encounter for screening mammogram for malignant neoplasm of breast: Secondary | ICD-10-CM | POA: Diagnosis not present

## 2018-06-18 DIAGNOSIS — R030 Elevated blood-pressure reading, without diagnosis of hypertension: Secondary | ICD-10-CM | POA: Diagnosis not present

## 2018-06-18 DIAGNOSIS — Z6829 Body mass index (BMI) 29.0-29.9, adult: Secondary | ICD-10-CM | POA: Diagnosis not present

## 2018-06-18 DIAGNOSIS — J13 Pneumonia due to Streptococcus pneumoniae: Secondary | ICD-10-CM | POA: Insufficient documentation

## 2018-06-18 DIAGNOSIS — R05 Cough: Secondary | ICD-10-CM | POA: Diagnosis not present

## 2018-06-18 DIAGNOSIS — J181 Lobar pneumonia, unspecified organism: Secondary | ICD-10-CM | POA: Diagnosis not present

## 2018-07-18 DIAGNOSIS — M859 Disorder of bone density and structure, unspecified: Secondary | ICD-10-CM | POA: Diagnosis not present

## 2018-07-18 DIAGNOSIS — Z6829 Body mass index (BMI) 29.0-29.9, adult: Secondary | ICD-10-CM | POA: Diagnosis not present

## 2018-07-18 DIAGNOSIS — J181 Lobar pneumonia, unspecified organism: Secondary | ICD-10-CM | POA: Diagnosis not present

## 2018-07-18 DIAGNOSIS — R030 Elevated blood-pressure reading, without diagnosis of hypertension: Secondary | ICD-10-CM | POA: Diagnosis not present

## 2018-09-12 DIAGNOSIS — R82998 Other abnormal findings in urine: Secondary | ICD-10-CM | POA: Diagnosis not present

## 2018-09-12 DIAGNOSIS — R7309 Other abnormal glucose: Secondary | ICD-10-CM | POA: Diagnosis not present

## 2018-09-12 DIAGNOSIS — E559 Vitamin D deficiency, unspecified: Secondary | ICD-10-CM | POA: Diagnosis not present

## 2018-09-12 DIAGNOSIS — E7849 Other hyperlipidemia: Secondary | ICD-10-CM | POA: Diagnosis not present

## 2018-09-20 DIAGNOSIS — N329 Bladder disorder, unspecified: Secondary | ICD-10-CM | POA: Diagnosis not present

## 2018-09-20 DIAGNOSIS — E559 Vitamin D deficiency, unspecified: Secondary | ICD-10-CM | POA: Diagnosis not present

## 2018-09-20 DIAGNOSIS — E7849 Other hyperlipidemia: Secondary | ICD-10-CM | POA: Diagnosis not present

## 2018-09-20 DIAGNOSIS — Z1331 Encounter for screening for depression: Secondary | ICD-10-CM | POA: Diagnosis not present

## 2018-09-20 DIAGNOSIS — J302 Other seasonal allergic rhinitis: Secondary | ICD-10-CM | POA: Diagnosis not present

## 2018-09-20 DIAGNOSIS — I10 Essential (primary) hypertension: Secondary | ICD-10-CM | POA: Insufficient documentation

## 2018-09-20 DIAGNOSIS — Z0000 Encounter for general adult medical examination without abnormal findings: Secondary | ICD-10-CM | POA: Diagnosis not present

## 2018-09-20 DIAGNOSIS — M8580 Other specified disorders of bone density and structure, unspecified site: Secondary | ICD-10-CM | POA: Diagnosis not present

## 2018-09-20 DIAGNOSIS — Z1339 Encounter for screening examination for other mental health and behavioral disorders: Secondary | ICD-10-CM | POA: Diagnosis not present

## 2018-09-20 DIAGNOSIS — C9100 Acute lymphoblastic leukemia not having achieved remission: Secondary | ICD-10-CM | POA: Diagnosis not present

## 2018-09-20 DIAGNOSIS — R7309 Other abnormal glucose: Secondary | ICD-10-CM | POA: Diagnosis not present

## 2018-09-20 DIAGNOSIS — R011 Cardiac murmur, unspecified: Secondary | ICD-10-CM | POA: Diagnosis not present

## 2018-09-27 ENCOUNTER — Other Ambulatory Visit: Payer: PPO

## 2018-09-27 ENCOUNTER — Ambulatory Visit: Payer: PPO | Admitting: Oncology

## 2018-12-27 ENCOUNTER — Telehealth: Payer: Self-pay

## 2018-12-27 NOTE — Telephone Encounter (Signed)
Called and left voicemail regarding pre-screening questions for appt on 6/26  

## 2018-12-28 ENCOUNTER — Inpatient Hospital Stay: Payer: PPO

## 2018-12-28 ENCOUNTER — Inpatient Hospital Stay: Payer: PPO | Admitting: Oncology

## 2018-12-28 ENCOUNTER — Telehealth: Payer: Self-pay | Admitting: Oncology

## 2018-12-28 NOTE — Telephone Encounter (Signed)
Called per 6/26 sch message - unable to reach pt . Left message for patient to call back to reschedule.

## 2019-03-21 DIAGNOSIS — C9100 Acute lymphoblastic leukemia not having achieved remission: Secondary | ICD-10-CM | POA: Diagnosis not present

## 2019-03-21 DIAGNOSIS — I10 Essential (primary) hypertension: Secondary | ICD-10-CM | POA: Diagnosis not present

## 2019-03-21 DIAGNOSIS — E669 Obesity, unspecified: Secondary | ICD-10-CM | POA: Diagnosis not present

## 2019-04-12 DIAGNOSIS — Z23 Encounter for immunization: Secondary | ICD-10-CM | POA: Diagnosis not present

## 2019-05-13 DIAGNOSIS — L821 Other seborrheic keratosis: Secondary | ICD-10-CM | POA: Diagnosis not present

## 2019-05-13 DIAGNOSIS — D1801 Hemangioma of skin and subcutaneous tissue: Secondary | ICD-10-CM | POA: Diagnosis not present

## 2019-05-13 DIAGNOSIS — B078 Other viral warts: Secondary | ICD-10-CM | POA: Diagnosis not present

## 2019-05-13 DIAGNOSIS — L570 Actinic keratosis: Secondary | ICD-10-CM | POA: Diagnosis not present

## 2019-05-13 DIAGNOSIS — L304 Erythema intertrigo: Secondary | ICD-10-CM | POA: Diagnosis not present

## 2019-05-13 DIAGNOSIS — D229 Melanocytic nevi, unspecified: Secondary | ICD-10-CM | POA: Diagnosis not present

## 2019-07-17 ENCOUNTER — Ambulatory Visit: Payer: Medicare Other | Attending: Internal Medicine

## 2019-07-17 DIAGNOSIS — Z23 Encounter for immunization: Secondary | ICD-10-CM | POA: Insufficient documentation

## 2019-07-17 NOTE — Progress Notes (Signed)
   Covid-19 Vaccination Clinic  Name:  [name]    MRN: [id number] DOB: 01-21-1941  07/17/2019  [name] was observed post Covid-19 immunization for 15 minutes without incidence. She was provided with Vaccine Information Sheet and instruction to access the V-Safe system.   [name] was instructed to call 911 with any severe reactions post vaccine: Marland Kitchen Difficulty breathing  . Swelling of your face and throat  . A fast heartbeat  . A bad rash all over your body  . Dizziness and weakness    Immunizations Administered    Name Date Dose VIS Date Route   Pfizer COVID-19 Vaccine 07/17/2019 12:14 PM 0.3 mL 06/14/2019 Intramuscular   Manufacturer: [employer], [employer]   Lot: [id number]   [place]: [id number]

## 2019-07-30 ENCOUNTER — Ambulatory Visit: Payer: PPO

## 2019-08-05 ENCOUNTER — Ambulatory Visit: Payer: PPO | Attending: Internal Medicine

## 2019-08-05 DIAGNOSIS — Z23 Encounter for immunization: Secondary | ICD-10-CM | POA: Insufficient documentation

## 2019-08-05 NOTE — Progress Notes (Signed)
   Covid-19 Vaccination Clinic  Name:  [name]    MRN: [id number] DOB: August 02, 1940  08/05/2019  [name] was observed post Covid-19 immunization for 15 minutes without incidence. She was provided with Vaccine Information Sheet and instruction to access the V-Safe system.   [name] was instructed to call 911 with any severe reactions post vaccine: Marland Kitchen Difficulty breathing  . Swelling of your face and throat  . A fast heartbeat  . A bad rash all over your body  . Dizziness and weakness    Immunizations Administered    Name Date Dose VIS Date Route   Pfizer COVID-19 Vaccine 08/05/2019 11:06 AM 0.3 mL 06/14/2019 Intramuscular   Manufacturer: [place]   Lot: [id number]   [place]: [id number]

## 2019-08-07 ENCOUNTER — Other Ambulatory Visit: Payer: Self-pay | Admitting: Oncology

## 2019-08-07 ENCOUNTER — Other Ambulatory Visit: Payer: Self-pay | Admitting: Internal Medicine

## 2019-08-07 ENCOUNTER — Telehealth: Payer: Self-pay

## 2019-08-07 DIAGNOSIS — C9110 Chronic lymphocytic leukemia of B-cell type not having achieved remission: Secondary | ICD-10-CM

## 2019-08-07 DIAGNOSIS — Z1231 Encounter for screening mammogram for malignant neoplasm of breast: Secondary | ICD-10-CM

## 2019-08-07 NOTE — Telephone Encounter (Signed)
-----   Message from Wyatt Portela, MD sent at 08/07/2019  3:08 PM EST ----- Regarding: RE: Patient Request for Follow-up Please send a message to scheduling. Lab + MD on 4/1. Thanks ----- Message ----- From: Teodoro Spray, RN Sent: 08/07/2019   2:50 PM EST To: Wyatt Portela, MD Subject: Patient Request for Follow-up                  Patient called office to see if she could be scheduled for follow-up appointment.  Patient last seen on 09/29/17. Patient was scheduled for follow-up in June 2020 but missed appointment.  Please advise.

## 2019-08-07 NOTE — Telephone Encounter (Signed)
See message below. Scheduling message sent. Patient informed to expect a phone call from our scheduling department with date and time of follow-up appointment.

## 2019-08-12 ENCOUNTER — Telehealth: Payer: Self-pay | Admitting: Oncology

## 2019-08-12 NOTE — Telephone Encounter (Signed)
I talk with patient regarding schedule  

## 2019-09-18 ENCOUNTER — Other Ambulatory Visit: Payer: Self-pay

## 2019-09-18 ENCOUNTER — Ambulatory Visit
Admission: RE | Admit: 2019-09-18 | Discharge: 2019-09-18 | Disposition: A | Discharge status: Home / Self Care | Payer: PPO | Source: Ambulatory Visit | Attending: Internal Medicine | Admitting: Internal Medicine

## 2019-09-18 DIAGNOSIS — Z1231 Encounter for screening mammogram for malignant neoplasm of breast: Secondary | ICD-10-CM | POA: Diagnosis not present

## 2019-10-03 ENCOUNTER — Inpatient Hospital Stay (HOSPITAL_BASED_OUTPATIENT_CLINIC_OR_DEPARTMENT_OTHER): Payer: PPO | Admitting: Oncology

## 2019-10-03 ENCOUNTER — Inpatient Hospital Stay: Payer: PPO | Attending: Oncology

## 2019-10-03 ENCOUNTER — Other Ambulatory Visit: Payer: Self-pay

## 2019-10-03 VITALS — BP 139/71 | HR 62 | Temp 98.2°F | Resp 20 | Ht 69.0 in | Wt 213.7 lb

## 2019-10-03 DIAGNOSIS — Z791 Long term (current) use of non-steroidal anti-inflammatories (NSAID): Secondary | ICD-10-CM | POA: Diagnosis not present

## 2019-10-03 DIAGNOSIS — C9110 Chronic lymphocytic leukemia of B-cell type not having achieved remission: Secondary | ICD-10-CM | POA: Insufficient documentation

## 2019-10-03 LAB — CBC WITH DIFFERENTIAL (CANCER CENTER ONLY)
Abs Immature Granulocytes: 0.02 10*3/uL (ref 0.00–0.07)
Basophils Absolute: 0.1 10*3/uL (ref 0.0–0.1)
Basophils Relative: 0 %
Eosinophils Absolute: 0.1 10*3/uL (ref 0.0–0.5)
Eosinophils Relative: 1 %
HCT: 41.5 % (ref 36.0–46.0)
Hemoglobin: 13.2 g/dL (ref 12.0–15.0)
Immature Granulocytes: 0 %
Lymphocytes Relative: 70 %
Lymphs Abs: 11.7 10*3/uL — ABNORMAL HIGH (ref 0.7–4.0)
MCH: 29.1 pg (ref 26.0–34.0)
MCHC: 31.8 g/dL (ref 30.0–36.0)
MCV: 91.4 fL (ref 80.0–100.0)
Monocytes Absolute: 0.5 10*3/uL (ref 0.1–1.0)
Monocytes Relative: 3 %
Neutro Abs: 4.3 10*3/uL (ref 1.7–7.7)
Neutrophils Relative %: 26 %
Platelet Count: 236 10*3/uL (ref 150–400)
RBC: 4.54 MIL/uL (ref 3.87–5.11)
RDW: 14.2 % (ref 11.5–15.5)
WBC Count: 16.7 10*3/uL — ABNORMAL HIGH (ref 4.0–10.5)
nRBC: 0 % (ref 0.0–0.2)

## 2019-10-03 LAB — CMP (CANCER CENTER ONLY)
ALT: 12 U/L (ref 0–44)
AST: 19 U/L (ref 15–41)
Albumin: 4.1 g/dL (ref 3.5–5.0)
Alkaline Phosphatase: 69 U/L (ref 38–126)
Anion gap: 11 (ref 5–15)
BUN: 15 mg/dL (ref 8–23)
CO2: 23 mmol/L (ref 22–32)
Calcium: 9.2 mg/dL (ref 8.9–10.3)
Chloride: 107 mmol/L (ref 98–111)
Creatinine: 0.75 mg/dL (ref 0.44–1.00)
GFR, Est AFR Am: >60 mL/min (ref >60)
GFR, Estimated: >60 mL/min (ref >60)
Glucose, Bld: 113 mg/dL — ABNORMAL HIGH (ref 70–99)
Potassium: 4.2 mmol/L (ref 3.5–5.1)
Sodium: 141 mmol/L (ref 135–145)
Total Bilirubin: 1.1 mg/dL (ref 0.3–1.2)
Total Protein: 6.7 g/dL (ref 6.5–8.1)

## 2019-10-03 NOTE — Progress Notes (Signed)
Hematology and Oncology Follow Up Visit  [name] [id number] 05-26-1941 79 y.o. 10/03/2019 9:37 AM   Principle Diagnosis: 79 year old woman with CLL diagnosed in 2013.  She presented with lymphocytosis only indicating stage 0 at this time.  Current therapy: Active surveillance.  Interim History:  [name] returns today for a repeat evaluation.  Since the last visit, she reports feeling well without any changes in her health.  She denies any recent hospitalizations or illnesses.  Her performance status and quality of life remain excellent.  She denies any lymphadenopathy, fatigue or constitutional symptoms.  She denies any weight loss or appetite changes.   Medications: Updated on review.  Current Outpatient Medications  Medication Sig Dispense Refill  . cetirizine (ZYRTEC) 10 MG chewable tablet Chew 10 mg by mouth daily. As needed for allergies     . Fish Oil-Krill Oil (KRILL OIL PLUS PO) Take 1 tablet by mouth daily.    Marland Kitchen glucosamine-chondroitin 500-400 MG tablet Take 1 tablet by mouth daily.      Marland Kitchen ibuprofen (ADVIL,MOTRIN) 200 MG tablet Take 200 mg by mouth every 6 (six) hours as needed.      . Multiple Vitamin (MULTIVITAMIN) capsule Take 1 capsule by mouth daily.       No current facility-administered medications for this visit.    Allergies:  Allergies  Allergen Reactions  . Bee Venom   . Penicillins        Physical Exam: Blood pressure 139/71, pulse 62, temperature 98.2 F (36.8 C), resp. rate 20, height 5\' 9"  (1.753 m), weight 213 lb 11.2 oz (96.9 kg), SpO2 98 %.   ECOG: 0   General appearance: Comfortable appearing without any discomfort Head: Normocephalic without any trauma Oropharynx: Mucous membranes are moist and pink without any thrush or ulcers. Eyes: Pupils are equal and round reactive to light. Lymph nodes: No cervical, supraclavicular, inguinal or axillary lymphadenopathy.   Heart:regular rate and rhythm.  S1 and S2 without leg edema. Lung:  Clear without any rhonchi or wheezes.  No dullness to percussion. Abdomin: Soft, nontender, nondistended with good bowel sounds.  No hepatosplenomegaly. Musculoskeletal: No joint deformity or effusion.  Full range of motion noted. Neurological: No deficits noted on motor, sensory and deep tendon reflex exam. Skin: No petechial rash or dryness.  Appeared moist.     Lab Results: Lab Results  Component Value Date   WBC 12.6 (H) 09/29/2017   HGB 14.3 09/29/2017   HCT 44.0 09/29/2017   MCV 90.2 09/29/2017   PLT 232 09/29/2017     Chemistry      Component Value Date/Time   NA 142 09/29/2017 0830   NA 142 06/08/2016 0918   K 4.1 09/29/2017 0830   K 4.5 06/08/2016 0918   CL 105 09/29/2017 0830   CL 105 09/12/2012 1258   CO2 27 09/29/2017 0830   CO2 26 06/08/2016 0918   BUN 14 09/29/2017 0830   BUN 14.8 06/08/2016 0918   CREATININE 0.70 09/29/2017 0830   CREATININE 0.7 06/08/2016 0918      Component Value Date/Time   CALCIUM 9.6 09/29/2017 0830   CALCIUM 9.6 06/08/2016 0918   ALKPHOS 78 09/29/2017 0830   ALKPHOS 75 06/08/2016 0918   AST 20 09/29/2017 0830   AST 19 06/08/2016 0918   ALT 21 09/29/2017 0830   ALT 16 06/08/2016 0918   BILITOT 0.8 09/29/2017 0830   BILITOT 0.87 06/08/2016 0918      Impression and Plan:  79 year old woman with:  1.  CLL presented with lymphocytosis and no lymphadenopathy.  She was found to have stage 0 in 2013.  She has been on active surveillance at this time without any indication for treatment at this time.  Laboratory data dating back to 2011 showed no dramatic changes in her lymphocytosis at this time.  The natural course of this disease and indication for treatment were reviewed at this time.  These would include rapid rise in her white cell count, cytopenias as well as painful adenopathy.  At this time she is not experiencing any of these factors.  Laboratory data from today were reviewed and showed a white cell count that is mildly  elevated but unchanged from the last 10 years.  At the time being I recommended continued active surveillance without any indication for treatment.  2.  Cytopenias: She has normal hemoglobin and platelet count without any evidence of marrow failure.  3.  Immune deficiency: Risk of opportunistic infection associated with CLL was reiterated.  She has not had any issues at this time.  4.  Follow-up: In 1 year for repeat evaluation.   30  minutes were dedicated to this visit. The time was spent on reviewing laboratory data,  discussing treatment indications, and answering questions regarding future plan.    Zola Button 4/1/20219:37 AM

## 2019-11-11 DIAGNOSIS — H5203 Hypermetropia, bilateral: Secondary | ICD-10-CM | POA: Diagnosis not present

## 2019-11-11 DIAGNOSIS — H2513 Age-related nuclear cataract, bilateral: Secondary | ICD-10-CM | POA: Diagnosis not present

## 2019-11-19 DIAGNOSIS — E7849 Other hyperlipidemia: Secondary | ICD-10-CM | POA: Diagnosis not present

## 2019-11-19 DIAGNOSIS — M859 Disorder of bone density and structure, unspecified: Secondary | ICD-10-CM | POA: Diagnosis not present

## 2019-11-25 DIAGNOSIS — J302 Other seasonal allergic rhinitis: Secondary | ICD-10-CM | POA: Diagnosis not present

## 2019-11-25 DIAGNOSIS — R82998 Other abnormal findings in urine: Secondary | ICD-10-CM | POA: Diagnosis not present

## 2019-11-25 DIAGNOSIS — R011 Cardiac murmur, unspecified: Secondary | ICD-10-CM | POA: Diagnosis not present

## 2019-11-25 DIAGNOSIS — Z91030 Bee allergy status: Secondary | ICD-10-CM | POA: Diagnosis not present

## 2019-11-25 DIAGNOSIS — Z1331 Encounter for screening for depression: Secondary | ICD-10-CM | POA: Diagnosis not present

## 2019-11-25 DIAGNOSIS — I8390 Asymptomatic varicose veins of unspecified lower extremity: Secondary | ICD-10-CM | POA: Diagnosis not present

## 2019-11-25 DIAGNOSIS — R739 Hyperglycemia, unspecified: Secondary | ICD-10-CM | POA: Diagnosis not present

## 2019-11-25 DIAGNOSIS — Z0000 Encounter for general adult medical examination without abnormal findings: Secondary | ICD-10-CM | POA: Diagnosis not present

## 2019-11-25 DIAGNOSIS — C9100 Acute lymphoblastic leukemia not having achieved remission: Secondary | ICD-10-CM | POA: Diagnosis not present

## 2019-11-25 DIAGNOSIS — E559 Vitamin D deficiency, unspecified: Secondary | ICD-10-CM | POA: Diagnosis not present

## 2019-11-25 DIAGNOSIS — N329 Bladder disorder, unspecified: Secondary | ICD-10-CM | POA: Diagnosis not present

## 2019-11-25 DIAGNOSIS — M8580 Other specified disorders of bone density and structure, unspecified site: Secondary | ICD-10-CM | POA: Diagnosis not present

## 2019-11-25 DIAGNOSIS — I10 Essential (primary) hypertension: Secondary | ICD-10-CM | POA: Diagnosis not present

## 2019-11-25 DIAGNOSIS — E785 Hyperlipidemia, unspecified: Secondary | ICD-10-CM | POA: Diagnosis not present

## 2019-11-27 DIAGNOSIS — Z1212 Encounter for screening for malignant neoplasm of rectum: Secondary | ICD-10-CM | POA: Diagnosis not present

## 2020-07-06 DIAGNOSIS — D229 Melanocytic nevi, unspecified: Secondary | ICD-10-CM | POA: Diagnosis not present

## 2020-07-06 DIAGNOSIS — B078 Other viral warts: Secondary | ICD-10-CM | POA: Diagnosis not present

## 2020-07-06 DIAGNOSIS — L400 Psoriasis vulgaris: Secondary | ICD-10-CM | POA: Diagnosis not present

## 2020-07-06 DIAGNOSIS — D1801 Hemangioma of skin and subcutaneous tissue: Secondary | ICD-10-CM | POA: Diagnosis not present

## 2020-07-06 DIAGNOSIS — D225 Melanocytic nevi of trunk: Secondary | ICD-10-CM | POA: Diagnosis not present

## 2020-07-06 DIAGNOSIS — L570 Actinic keratosis: Secondary | ICD-10-CM | POA: Diagnosis not present

## 2020-07-06 DIAGNOSIS — L821 Other seborrheic keratosis: Secondary | ICD-10-CM | POA: Diagnosis not present

## 2020-07-06 DIAGNOSIS — D485 Neoplasm of uncertain behavior of skin: Secondary | ICD-10-CM | POA: Diagnosis not present

## 2020-08-19 ENCOUNTER — Other Ambulatory Visit: Payer: Self-pay | Admitting: Internal Medicine

## 2020-08-19 DIAGNOSIS — Z1231 Encounter for screening mammogram for malignant neoplasm of breast: Secondary | ICD-10-CM

## 2020-09-22 DIAGNOSIS — I10 Essential (primary) hypertension: Secondary | ICD-10-CM | POA: Diagnosis not present

## 2020-09-28 DIAGNOSIS — D485 Neoplasm of uncertain behavior of skin: Secondary | ICD-10-CM | POA: Diagnosis not present

## 2020-09-28 DIAGNOSIS — C44321 Squamous cell carcinoma of skin of nose: Secondary | ICD-10-CM | POA: Diagnosis not present

## 2020-10-02 ENCOUNTER — Other Ambulatory Visit: Payer: PPO

## 2020-10-02 ENCOUNTER — Telehealth: Payer: Self-pay | Admitting: Oncology

## 2020-10-02 ENCOUNTER — Ambulatory Visit: Payer: PPO | Admitting: Oncology

## 2020-10-02 NOTE — Telephone Encounter (Signed)
R/s appts per 4/1 sch msg. Pt aware.

## 2020-10-08 ENCOUNTER — Ambulatory Visit: Payer: PPO

## 2020-11-06 ENCOUNTER — Inpatient Hospital Stay: Payer: PPO

## 2020-11-06 ENCOUNTER — Inpatient Hospital Stay: Payer: PPO | Attending: Oncology | Admitting: Oncology

## 2020-11-06 ENCOUNTER — Other Ambulatory Visit: Payer: Self-pay

## 2020-11-06 VITALS — BP 143/75 | HR 74 | Temp 97.7°F | Resp 18 | Ht 69.0 in | Wt 197.3 lb

## 2020-11-06 DIAGNOSIS — C9110 Chronic lymphocytic leukemia of B-cell type not having achieved remission: Secondary | ICD-10-CM | POA: Insufficient documentation

## 2020-11-06 DIAGNOSIS — Z79899 Other long term (current) drug therapy: Secondary | ICD-10-CM | POA: Diagnosis not present

## 2020-11-06 LAB — CBC WITH DIFFERENTIAL (CANCER CENTER ONLY)
Abs Immature Granulocytes: 0.02 10*3/uL (ref 0.00–0.07)
Basophils Absolute: 0.1 10*3/uL (ref 0.0–0.1)
Basophils Relative: 0 %
Eosinophils Absolute: 0.1 10*3/uL (ref 0.0–0.5)
Eosinophils Relative: 1 %
HCT: 39.6 % (ref 36.0–46.0)
Hemoglobin: 13.2 g/dL (ref 12.0–15.0)
Immature Granulocytes: 0 %
Lymphocytes Relative: 72 %
Lymphs Abs: 13.1 10*3/uL — ABNORMAL HIGH (ref 0.7–4.0)
MCH: 30.6 pg (ref 26.0–34.0)
MCHC: 33.3 g/dL (ref 30.0–36.0)
MCV: 91.9 fL (ref 80.0–100.0)
Monocytes Absolute: 0.5 10*3/uL (ref 0.1–1.0)
Monocytes Relative: 3 %
Neutro Abs: 4.3 10*3/uL (ref 1.7–7.7)
Neutrophils Relative %: 24 %
Platelet Count: 248 10*3/uL (ref 150–400)
RBC: 4.31 MIL/uL (ref 3.87–5.11)
RDW: 13.6 % (ref 11.5–15.5)
WBC Count: 18.1 10*3/uL — ABNORMAL HIGH (ref 4.0–10.5)
nRBC: 0.1 % (ref 0.0–0.2)

## 2020-11-06 LAB — CMP (CANCER CENTER ONLY)
ALT: 13 U/L (ref 0–44)
AST: 19 U/L (ref 15–41)
Albumin: 4.2 g/dL (ref 3.5–5.0)
Alkaline Phosphatase: 68 U/L (ref 38–126)
Anion gap: 13 (ref 5–15)
BUN: 20 mg/dL (ref 8–23)
CO2: 25 mmol/L (ref 22–32)
Calcium: 9.6 mg/dL (ref 8.9–10.3)
Chloride: 104 mmol/L (ref 98–111)
Creatinine: 0.78 mg/dL (ref 0.44–1.00)
GFR, Estimated: >60 mL/min (ref >60)
Glucose, Bld: 102 mg/dL — ABNORMAL HIGH (ref 70–99)
Potassium: 4.3 mmol/L (ref 3.5–5.1)
Sodium: 142 mmol/L (ref 135–145)
Total Bilirubin: 1 mg/dL (ref 0.3–1.2)
Total Protein: 6.9 g/dL (ref 6.5–8.1)

## 2020-11-06 NOTE — Progress Notes (Signed)
Hematology and Oncology Follow Up Visit  [name] [id number] 06-16-1941 80 y.o. 11/06/2020 2:52 PM    Principle Diagnosis: 80 year old woman with stage 0 CLL presented with lymphocytosis in 2013.    Current therapy: Observation and surveillance.  No indication for treatment.  Interim History:  [name] is here for a follow-up visit.  Since the last visit, she reports no major changes in her health.  She denies any painful adenopathy, constitutional symptoms or excessive fatigue.  Her performance status quality of life remain excellent.  She denies any recurrent infections or surgery.    Medications: Updated on review.  Current Outpatient Medications  Medication Sig Dispense Refill  . cetirizine (ZYRTEC) 10 MG chewable tablet Chew 10 mg by mouth daily. As needed for allergies     . Fish Oil-Krill Oil (KRILL OIL PLUS PO) Take 1 tablet by mouth daily.    Marland Kitchen glucosamine-chondroitin 500-400 MG tablet Take 1 tablet by mouth daily.      Marland Kitchen ibuprofen (ADVIL,MOTRIN) 200 MG tablet Take 200 mg by mouth every 6 (six) hours as needed.      Marland Kitchen lisinopril (ZESTRIL) 40 MG tablet Take 40 mg by mouth daily.    . Multiple Vitamin (MULTIVITAMIN) capsule Take 1 capsule by mouth daily.       No current facility-administered medications for this visit.    Allergies:  Allergies  Allergen Reactions  . Bee Venom   . Penicillins        Physical Exam:  Blood pressure (!) 143/75, pulse 74, temperature 97.7 F (36.5 C), temperature source Tympanic, resp. rate 18, height 5\' 9"  (1.753 m), weight 197 lb 4.8 oz (89.5 kg), SpO2 98 %.   ECOG: 0     General appearance: Alert, awake without any distress. Head: Atraumatic without abnormalities Oropharynx: Without any thrush or ulcers. Eyes: No scleral icterus. Lymph nodes: No lymphadenopathy noted in the cervical, supraclavicular, or axillary nodes Heart:regular rate and rhythm, without any murmurs or gallops.   Lung: Clear to auscultation without  any rhonchi, wheezes or dullness to percussion. Abdomin: Soft, nontender without any shifting dullness or ascites. Musculoskeletal: No clubbing or cyanosis. Neurological: No motor or sensory deficits. Skin: No rashes or lesions.    Lab Results: Lab Results  Component Value Date   WBC 16.7 (H) 10/03/2019   HGB 13.2 10/03/2019   HCT 41.5 10/03/2019   MCV 91.4 10/03/2019   PLT 236 10/03/2019     Chemistry      Component Value Date/Time   NA 141 10/03/2019 0928   NA 142 06/08/2016 0918   K 4.2 10/03/2019 0928   K 4.5 06/08/2016 0918   CL 107 10/03/2019 0928   CL 105 09/12/2012 1258   CO2 23 10/03/2019 0928   CO2 26 06/08/2016 0918   BUN 15 10/03/2019 0928   BUN 14.8 06/08/2016 0918   CREATININE 0.75 10/03/2019 0928   CREATININE 0.7 06/08/2016 0918      Component Value Date/Time   CALCIUM 9.2 10/03/2019 0928   CALCIUM 9.6 06/08/2016 0918   ALKPHOS 69 10/03/2019 0928   ALKPHOS 75 06/08/2016 0918   AST 19 10/03/2019 0928   AST 19 06/08/2016 0918   ALT 12 10/03/2019 0928   ALT 16 06/08/2016 0918   BILITOT 1.1 10/03/2019 0928   BILITOT 0.87 06/08/2016 0918      Impression and Plan:  80 year old woman with:  1.  Stage 0 CLL presented with lymphocytosis and no lymphadenopathy in 2013.   The  natural course of her disease was reviewed at this time and treatment choices were reiterated.  Indication for treatment of this condition were discussed including rapid rise in her white cell count, painful adenopathy, bone marrow failure as well as autoimmune considerations.  At this time, she is not exhibiting any of these issues and I recommended active surveillance.  Treatment options in the future including chemotherapy, immunotherapy and oral targeted agents were reviewed.  Laboratory data from today reviewed and showed a white cell count and lymphocytosis that has not dramatically changed over the last 10 years.  2.  Cytopenias: No evidence of anemia or thrombocytopenia at  this time.  These will be monitored in the future.  3.  Immune deficiency: Risk of opportunistic infection associated with CLL were discussed at this time.  The importance of completing Vaccination series including COVID-19 were discussed.  4.  Follow-up: In 12 months for repeat follow-up.   30  minutes were spent on this encounter.  The time was dedicated to reviewing laboratory data, disease status update and outlining future plan of care.    Zola Button 5/6/20222:52 PM

## 2020-11-24 DIAGNOSIS — D0439 Carcinoma in situ of skin of other parts of face: Secondary | ICD-10-CM | POA: Diagnosis not present

## 2020-11-26 ENCOUNTER — Ambulatory Visit
Admission: RE | Admit: 2020-11-26 | Discharge: 2020-11-26 | Disposition: A | Discharge status: Home / Self Care | Payer: PPO | Source: Ambulatory Visit | Attending: Internal Medicine | Admitting: Internal Medicine

## 2020-11-26 ENCOUNTER — Other Ambulatory Visit: Payer: Self-pay

## 2020-11-26 DIAGNOSIS — E559 Vitamin D deficiency, unspecified: Secondary | ICD-10-CM | POA: Diagnosis not present

## 2020-11-26 DIAGNOSIS — Z1231 Encounter for screening mammogram for malignant neoplasm of breast: Secondary | ICD-10-CM | POA: Diagnosis not present

## 2020-11-26 DIAGNOSIS — R739 Hyperglycemia, unspecified: Secondary | ICD-10-CM | POA: Diagnosis not present

## 2020-11-26 DIAGNOSIS — E785 Hyperlipidemia, unspecified: Secondary | ICD-10-CM | POA: Diagnosis not present

## 2020-11-26 DIAGNOSIS — Z125 Encounter for screening for malignant neoplasm of prostate: Secondary | ICD-10-CM | POA: Diagnosis not present

## 2020-12-03 DIAGNOSIS — I10 Essential (primary) hypertension: Secondary | ICD-10-CM | POA: Diagnosis not present

## 2020-12-03 DIAGNOSIS — M8580 Other specified disorders of bone density and structure, unspecified site: Secondary | ICD-10-CM | POA: Diagnosis not present

## 2020-12-03 DIAGNOSIS — Z0000 Encounter for general adult medical examination without abnormal findings: Secondary | ICD-10-CM | POA: Diagnosis not present

## 2020-12-03 DIAGNOSIS — R739 Hyperglycemia, unspecified: Secondary | ICD-10-CM | POA: Diagnosis not present

## 2020-12-03 DIAGNOSIS — E785 Hyperlipidemia, unspecified: Secondary | ICD-10-CM | POA: Diagnosis not present

## 2020-12-03 DIAGNOSIS — M1990 Unspecified osteoarthritis, unspecified site: Secondary | ICD-10-CM | POA: Diagnosis not present

## 2020-12-03 DIAGNOSIS — R011 Cardiac murmur, unspecified: Secondary | ICD-10-CM | POA: Diagnosis not present

## 2020-12-03 DIAGNOSIS — C9100 Acute lymphoblastic leukemia not having achieved remission: Secondary | ICD-10-CM | POA: Diagnosis not present

## 2020-12-03 DIAGNOSIS — Z91030 Bee allergy status: Secondary | ICD-10-CM | POA: Diagnosis not present

## 2020-12-03 DIAGNOSIS — R82998 Other abnormal findings in urine: Secondary | ICD-10-CM | POA: Diagnosis not present

## 2020-12-03 DIAGNOSIS — E663 Overweight: Secondary | ICD-10-CM | POA: Diagnosis not present

## 2020-12-03 DIAGNOSIS — C300 Malignant neoplasm of nasal cavity: Secondary | ICD-10-CM | POA: Diagnosis not present

## 2020-12-03 DIAGNOSIS — E559 Vitamin D deficiency, unspecified: Secondary | ICD-10-CM | POA: Diagnosis not present

## 2021-01-12 DIAGNOSIS — M8589 Other specified disorders of bone density and structure, multiple sites: Secondary | ICD-10-CM | POA: Diagnosis not present

## 2021-01-12 DIAGNOSIS — D485 Neoplasm of uncertain behavior of skin: Secondary | ICD-10-CM | POA: Diagnosis not present

## 2021-01-12 DIAGNOSIS — H52203 Unspecified astigmatism, bilateral: Secondary | ICD-10-CM | POA: Diagnosis not present

## 2021-01-12 DIAGNOSIS — H2513 Age-related nuclear cataract, bilateral: Secondary | ICD-10-CM | POA: Diagnosis not present

## 2021-01-27 ENCOUNTER — Encounter: Payer: Self-pay | Admitting: Podiatry

## 2021-01-27 ENCOUNTER — Other Ambulatory Visit: Payer: Self-pay

## 2021-01-27 ENCOUNTER — Ambulatory Visit: Payer: PPO | Admitting: Podiatry

## 2021-01-27 ENCOUNTER — Ambulatory Visit: Payer: PPO

## 2021-01-27 DIAGNOSIS — L600 Ingrowing nail: Secondary | ICD-10-CM | POA: Diagnosis not present

## 2021-01-27 DIAGNOSIS — M2042 Other hammer toe(s) (acquired), left foot: Secondary | ICD-10-CM

## 2021-01-27 DIAGNOSIS — L84 Corns and callosities: Secondary | ICD-10-CM | POA: Diagnosis not present

## 2021-01-27 NOTE — Patient Instructions (Signed)
Place 1/4 cup of epsom salts in a quart of warm tap water.  Submerge your foot or feet in the solution and soak for 20 minutes.  This soak should be done twice a day.  Next, remove your foot or feet from solution, blot dry the affected area. Apply ointment and cover if instructed by your doctor.   IF YOUR SKIN BECOMES IRRITATED WHILE USING THESE INSTRUCTIONS, IT IS OKAY TO SWITCH TO  WHITE VINEGAR AND WATER.  As another alternative soak, you may use antibacterial soap and water.  Monitor for any signs/symptoms of infection. Call the office immediately if any occur or go directly to the emergency room. Call with any questions/concerns.  Ingrown Toenail An ingrown toenail occurs when the corner or sides of a toenail grow into the surrounding skin. This causes discomfort and pain. The big toe is most commonly affected, but any of the toes can be affected. If an ingrown toenail is not treated, it can become infected. What are the causes? This condition may be caused by:  Wearing shoes that are too small or tight.  An injury, such as stubbing your toe or having your toe stepped on.  Improper cutting or care of your toenails.  Having nail or foot abnormalities that were present from birth (congenital abnormalities), such as having a nail that is too big for your toe. What increases the risk? The following factors may make you more likely to develop ingrown toenails:  Age. Nails tend to get thicker with age, so ingrown nails are more common among older people.  Cutting your toenails incorrectly, such as cutting them very short or cutting them unevenly. An ingrown toenail is more likely to get infected if you have:  Diabetes.  Blood flow (circulation) problems. What are the signs or symptoms? Symptoms of an ingrown toenail may include:  Pain, soreness, or tenderness.  Redness.  Swelling.  Hardening of the skin that surrounds the toenail. Signs that an ingrown toenail may be infected  include:  Fluid or pus.  Symptoms that get worse instead of better. How is this diagnosed? An ingrown toenail may be diagnosed based on your medical history, your symptoms, and a physical exam. If you have fluid or blood coming from your toenail, a sample may be collected to test for the specific type of bacteria that is causing the infection. How is this treated? Treatment depends on how severe your ingrown toenail is. You may be able to care for your toenail at home.  If you have an infection, you may be prescribed antibiotic medicines.  If you have fluid or pus draining from your toenail, your health care provider may drain it.  If you have trouble walking, you may be given crutches to use.  If you have a severe or infected ingrown toenail, you may need a procedure to remove part or all of the nail. Follow these instructions at home: Foot care  Do not pick at your toenail or try to remove it yourself.  Soak your foot in warm, soapy water. Do this for 20 minutes, 3 times a day, or as often as told by your health care provider. This helps to keep your toe clean and keep your skin soft.  Wear shoes that fit well and are not too tight. Your health care provider may recommend that you wear open-toed shoes while you heal.  Trim your toenails regularly and carefully. Cut your toenails straight across to prevent injury to the skin at the corners   of the toenail. Do not cut your nails in a curved shape.  Keep your feet clean and dry to help prevent infection.   Medicines  Take over-the-counter and prescription medicines only as told by your health care provider.  If you were prescribed an antibiotic, take it as told by your health care provider. Do not stop taking the antibiotic even if you start to feel better. Activity  Return to your normal activities as told by your health care provider. Ask your health care provider what activities are safe for you.  Avoid activities that cause  pain. General instructions  If your health care provider told you to use crutches to help you move around, use them as instructed.  Keep all follow-up visits as told by your health care provider. This is important. Contact a health care provider if:  You have more redness, swelling, pain, or other symptoms that do not improve with treatment.  You have fluid, blood, or pus coming from your toenail. Get help right away if:  You have a red streak on your skin that starts at your foot and spreads up your leg.  You have a fever. Summary  An ingrown toenail occurs when the corner or sides of a toenail grow into the surrounding skin. This causes discomfort and pain. The big toe is most commonly affected, but any of the toes can be affected.  If an ingrown toenail is not treated, it can become infected.  Fluid or pus draining from your toenail is a sign of infection. Your health care provider may need to drain it. You may be given antibiotics to treat the infection.  Trimming your toenails regularly and properly can help you prevent an ingrown toenail. This information is not intended to replace advice given to you by your health care provider. Make sure you discuss any questions you have with your health care provider. Document Revised: 10/12/2018 Document Reviewed: 03/08/2017 Elsevier Patient Education  2021 [employer]  

## 2021-01-27 NOTE — Progress Notes (Signed)
Subjective:   Patient ID: [name], female   DOB: 80 y.o.   MRN: [id number]   HPI Patient states she has a painful third nail left that she cannot cut and its become more of an issue for her and also has digital deformities of the second and third toe left and history of me fixing her right foot.  States she has a painful lesion at the end of the third toe left that makes walking difficult and she does not currently smoke likes to be active   Review of Systems  All other systems reviewed and are negative.      Objective:  Physical Exam Vitals and nursing note reviewed.  Constitutional:      Appearance: She is well-developed.  Pulmonary:     Effort: Pulmonary effort is normal.  Musculoskeletal:        General: Normal range of motion.  Skin:    General: Skin is warm.  Neurological:     Mental Status: She is alert.    Neurovascular status was found to be intact muscle strength is adequate range of motion adequate for her age.  She does have structural deformity of the second and third digits left with elevated toes rigid contracture and distal keratotic lesion digit 3 left and a thickened third nail left that irritated with ingrown toenail component.  Patient has good digital perfusion well oriented x3     Assessment:  Damaged third nail left with chronic pain along with hammertoe deformity and distal keratotic lesion digit 3 left painful with history of correction of right foot with digital fusion     Plan:  H PA all conditions reviewed discussed and recommended nail removal.  I explained procedure risk and patient wants surgery and at this time signed consent form and today I infiltrated 60 mg like Marcaine mixture sterile prep done using sterile instrumentation I remove the third nail exposed the root exposed quite 4 applications of phenol 30 seconds followed by alcohol lavage sterile dressing and then did sterile sharp debridement of lesion and the third toe and advised on  shoe gear modifications and will be seen back and understands may ultimately require digital fusion  X-rays indicate there is elevation of the second and third digits left with distal pressure against the second and third toes

## 2021-02-15 DIAGNOSIS — Z1212 Encounter for screening for malignant neoplasm of rectum: Secondary | ICD-10-CM | POA: Diagnosis not present

## 2021-02-15 DIAGNOSIS — Z1211 Encounter for screening for malignant neoplasm of colon: Secondary | ICD-10-CM | POA: Diagnosis not present

## 2021-03-25 DIAGNOSIS — H2511 Age-related nuclear cataract, right eye: Secondary | ICD-10-CM | POA: Diagnosis not present

## 2021-03-25 DIAGNOSIS — H25811 Combined forms of age-related cataract, right eye: Secondary | ICD-10-CM | POA: Diagnosis not present

## 2021-04-15 DIAGNOSIS — H25812 Combined forms of age-related cataract, left eye: Secondary | ICD-10-CM | POA: Diagnosis not present

## 2021-04-15 DIAGNOSIS — H2512 Age-related nuclear cataract, left eye: Secondary | ICD-10-CM | POA: Diagnosis not present

## 2021-09-01 DIAGNOSIS — L814 Other melanin hyperpigmentation: Secondary | ICD-10-CM | POA: Diagnosis not present

## 2021-09-01 DIAGNOSIS — B078 Other viral warts: Secondary | ICD-10-CM | POA: Diagnosis not present

## 2021-09-01 DIAGNOSIS — D485 Neoplasm of uncertain behavior of skin: Secondary | ICD-10-CM | POA: Diagnosis not present

## 2021-09-01 DIAGNOSIS — Z86007 Personal history of in-situ neoplasm of skin: Secondary | ICD-10-CM | POA: Diagnosis not present

## 2021-09-01 DIAGNOSIS — L821 Other seborrheic keratosis: Secondary | ICD-10-CM | POA: Diagnosis not present

## 2021-09-01 DIAGNOSIS — D225 Melanocytic nevi of trunk: Secondary | ICD-10-CM | POA: Diagnosis not present

## 2021-09-01 DIAGNOSIS — L570 Actinic keratosis: Secondary | ICD-10-CM | POA: Diagnosis not present

## 2021-09-01 DIAGNOSIS — Z08 Encounter for follow-up examination after completed treatment for malignant neoplasm: Secondary | ICD-10-CM | POA: Diagnosis not present

## 2021-09-13 ENCOUNTER — Other Ambulatory Visit: Payer: Self-pay | Admitting: Ophthalmology

## 2021-09-13 DIAGNOSIS — D2310 Other benign neoplasm of skin of unspecified eyelid, including canthus: Secondary | ICD-10-CM | POA: Diagnosis not present

## 2021-09-13 DIAGNOSIS — C441122 Basal cell carcinoma of skin of right lower eyelid, including canthus: Secondary | ICD-10-CM | POA: Diagnosis not present

## 2021-11-05 ENCOUNTER — Inpatient Hospital Stay: Payer: PPO | Admitting: Oncology

## 2021-11-05 ENCOUNTER — Telehealth: Payer: Self-pay | Admitting: Oncology

## 2021-11-05 ENCOUNTER — Inpatient Hospital Stay: Payer: PPO

## 2021-11-05 NOTE — Telephone Encounter (Signed)
.  Called patient to schedule appointment per 5/3 inb, patient is aware of date and time.   ?

## 2021-11-08 ENCOUNTER — Other Ambulatory Visit: Payer: Self-pay | Admitting: Internal Medicine

## 2021-11-08 DIAGNOSIS — Z1231 Encounter for screening mammogram for malignant neoplasm of breast: Secondary | ICD-10-CM

## 2021-11-30 ENCOUNTER — Ambulatory Visit
Admission: RE | Admit: 2021-11-30 | Discharge: 2021-11-30 | Disposition: A | Discharge status: Home / Self Care | Payer: PPO | Source: Ambulatory Visit | Attending: Internal Medicine | Admitting: Internal Medicine

## 2021-11-30 DIAGNOSIS — Z1231 Encounter for screening mammogram for malignant neoplasm of breast: Secondary | ICD-10-CM | POA: Diagnosis not present

## 2021-12-10 ENCOUNTER — Inpatient Hospital Stay: Payer: PPO | Attending: Oncology

## 2021-12-10 ENCOUNTER — Other Ambulatory Visit: Payer: Self-pay

## 2021-12-10 ENCOUNTER — Inpatient Hospital Stay (HOSPITAL_BASED_OUTPATIENT_CLINIC_OR_DEPARTMENT_OTHER): Payer: PPO | Admitting: Oncology

## 2021-12-10 VITALS — BP 141/84 | HR 86 | Temp 97.5°F | Resp 19 | Ht 69.0 in | Wt 201.5 lb

## 2021-12-10 DIAGNOSIS — C9110 Chronic lymphocytic leukemia of B-cell type not having achieved remission: Secondary | ICD-10-CM | POA: Diagnosis not present

## 2021-12-10 DIAGNOSIS — R739 Hyperglycemia, unspecified: Secondary | ICD-10-CM | POA: Diagnosis not present

## 2021-12-10 DIAGNOSIS — E559 Vitamin D deficiency, unspecified: Secondary | ICD-10-CM | POA: Diagnosis not present

## 2021-12-10 DIAGNOSIS — Z79899 Other long term (current) drug therapy: Secondary | ICD-10-CM | POA: Insufficient documentation

## 2021-12-10 DIAGNOSIS — I10 Essential (primary) hypertension: Secondary | ICD-10-CM | POA: Diagnosis not present

## 2021-12-10 DIAGNOSIS — E785 Hyperlipidemia, unspecified: Secondary | ICD-10-CM | POA: Diagnosis not present

## 2021-12-10 LAB — CBC WITH DIFFERENTIAL (CANCER CENTER ONLY)
Abs Immature Granulocytes: 0.03 10*3/uL (ref 0.00–0.07)
Basophils Absolute: 0 10*3/uL (ref 0.0–0.1)
Basophils Relative: 0 %
Eosinophils Absolute: 0.2 10*3/uL (ref 0.0–0.5)
Eosinophils Relative: 1 %
HCT: 39.4 % (ref 36.0–46.0)
Hemoglobin: 13 g/dL (ref 12.0–15.0)
Immature Granulocytes: 0 %
Lymphocytes Relative: 66 %
Lymphs Abs: 11.1 10*3/uL — ABNORMAL HIGH (ref 0.7–4.0)
MCH: 30 pg (ref 26.0–34.0)
MCHC: 33 g/dL (ref 30.0–36.0)
MCV: 90.8 fL (ref 80.0–100.0)
Monocytes Absolute: 0.6 10*3/uL (ref 0.1–1.0)
Monocytes Relative: 3 %
Neutro Abs: 5 10*3/uL (ref 1.7–7.7)
Neutrophils Relative %: 30 %
Platelet Count: 238 10*3/uL (ref 150–400)
RBC: 4.34 MIL/uL (ref 3.87–5.11)
RDW: 14.1 % (ref 11.5–15.5)
Smear Review: NORMAL
WBC Count: 17 10*3/uL — ABNORMAL HIGH (ref 4.0–10.5)
nRBC: 0 % (ref 0.0–0.2)

## 2021-12-10 LAB — CMP (CANCER CENTER ONLY)
ALT: 12 U/L (ref 0–44)
AST: 17 U/L (ref 15–41)
Albumin: 4.5 g/dL (ref 3.5–5.0)
Alkaline Phosphatase: 57 U/L (ref 38–126)
Anion gap: 7 (ref 5–15)
BUN: 22 mg/dL (ref 8–23)
CO2: 28 mmol/L (ref 22–32)
Calcium: 9.8 mg/dL (ref 8.9–10.3)
Chloride: 106 mmol/L (ref 98–111)
Creatinine: 0.74 mg/dL (ref 0.44–1.00)
GFR, Estimated: >60 mL/min (ref >60)
Glucose, Bld: 110 mg/dL — ABNORMAL HIGH (ref 70–99)
Potassium: 4.1 mmol/L (ref 3.5–5.1)
Sodium: 141 mmol/L (ref 135–145)
Total Bilirubin: 1 mg/dL (ref 0.3–1.2)
Total Protein: 6.8 g/dL (ref 6.5–8.1)

## 2021-12-10 NOTE — Progress Notes (Signed)
Hematology and Oncology Follow Up Visit  [name] [id number] [birth year] 81 y.o. 12/10/2021 12:41 PM    Principle Diagnosis: 81 year old woman with CLL diagnosed in 2013.  She was found to have stage 0 with lymphocytosis and no indication for treatment.  Current therapy: Active surveillance.  Interim History:  [name] is here for repeat follow-up.  Since the last visit, she reports feeling well without any major complaints.  She denies nausea or vomiting or abdominal pain.  She denies any constitutional symptoms.  She denies any hospitalizations or illnesses.  She remains active and attends activities of daily living.  Medications: Reviewed without changes.  Current Outpatient Medications  Medication Sig Dispense Refill   azithromycin (ZITHROMAX) 250 MG tablet 2 tablet  on the first day, then 1 tablet daily for 4 days     Calcium Carb-Cholecalciferol (CALCIUM + D3) 600-800 MG-UNIT TABS Take 1 tablet by mouth daily.     cetirizine (ZYRTEC) 10 MG chewable tablet Chew 10 mg by mouth daily. As needed for allergies     Fish Oil-Krill Oil (KRILL OIL PLUS PO) Take 1 tablet by mouth daily.     glucosamine-chondroitin 500-400 MG tablet Take 1 tablet by mouth daily.     HYDROCHLOROTHIAZIDE PO Take by mouth. Patient unsure of dose     ibuprofen (ADVIL,MOTRIN) 200 MG tablet Take 200 mg by mouth every 6 (six) hours as needed.     Krill Oil 500 MG CAPS take one a day     lisinopril (ZESTRIL) 40 MG tablet Take 40 mg by mouth daily.     Multiple Vitamin (MULTIVITAMIN) capsule Take 1 capsule by mouth daily.     olmesartan (BENICAR) 40 MG tablet Take 1 tablet by mouth daily.     OLMESARTAN MEDOXOMIL PO Take 40 mg by mouth daily.     vitamin E 200 UNIT capsule Take 1 tablet by mouth daily.     Zoster Vaccine Adjuvanted [facility]) injection OK for 2 part Vaccine     No current facility-administered medications for this visit.    Allergies:  Allergies  Allergen Reactions   Bee Venom     Penicillins        Physical Exam:  Blood pressure (!) 141/84, pulse 86, temperature (!) 97.5 F (36.4 C), temperature source Temporal, resp. rate 19, height '5\' 9"'$  (1.753 m), weight 201 lb 8 oz (91.4 kg), SpO2 97 %.   ECOG: 0    General appearance: Comfortable appearing without any discomfort Head: Normocephalic without any trauma Oropharynx: Mucous membranes are moist and pink without any thrush or ulcers. Eyes: Pupils are equal and round reactive to light. Lymph nodes: No cervical, supraclavicular, inguinal or axillary lymphadenopathy.   Heart:regular rate and rhythm.  S1 and S2 without leg edema. Lung: Clear without any rhonchi or wheezes.  No dullness to percussion. Abdomin: Soft, nontender, nondistended with good bowel sounds.  No hepatosplenomegaly. Musculoskeletal: No joint deformity or effusion.  Full range of motion noted. Neurological: No deficits noted on motor, sensory and deep tendon reflex exam. Skin: No petechial rash or dryness.  Appeared moist.  .    Lab Results: Lab Results  Component Value Date   WBC 17.0 (H) 12/10/2021   HGB 13.0 12/10/2021   HCT 39.4 12/10/2021   MCV 90.8 12/10/2021   PLT 238 12/10/2021     Chemistry      Component Value Date/Time   NA 142 11/06/2020 1447   NA 142 06/08/2016 0918   K 4.3  11/06/2020 1447   K 4.5 06/08/2016 0918   CL 104 11/06/2020 1447   CL 105 09/12/2012 1258   CO2 25 11/06/2020 1447   CO2 26 06/08/2016 0918   BUN 20 11/06/2020 1447   BUN 14.8 06/08/2016 0918   CREATININE 0.78 11/06/2020 1447   CREATININE 0.7 06/08/2016 0918      Component Value Date/Time   CALCIUM 9.6 11/06/2020 1447   CALCIUM 9.6 06/08/2016 0918   ALKPHOS 68 11/06/2020 1447   ALKPHOS 75 06/08/2016 0918   AST 19 11/06/2020 1447   AST 19 06/08/2016 0918   ALT 13 11/06/2020 1447   ALT 16 06/08/2016 0918   BILITOT 1.0 11/06/2020 1447   BILITOT 0.87 06/08/2016 0918      Impression and Plan:  81 year old woman with:  1.  CLL  diagnosed in 2013.  She was found to have stage 0 without any indication for treatment.  Laboratory data reviewed today and compared to previous counts which continues to show mild elevation of her lymphocyte count but has not dramatically changed over the last 11 years.  She remains asymptomatic without any indication for treatment.  These were reiterated again including cytopenia, symptomatic lymphadenopathy among others.  I recommended continued annual surveillance at this time.   2.  Cytopenias: Normal hemoglobin and platelet count noted at this time.  3.  Immune deficiency: We continue to discuss age-appropriate vaccination given the possibility of an immune deficiency associated with CLL.  She has not had any opportunistic infections.  She is up-to-date on her COVID vaccination.  4.  Follow-up: In 12 months for repeat follow-up.   30  minutes were spent on this visit.  The time was dedicated to reviewing laboratory data, disease status update, indication for treatment as well as future plan of care discussion.    Zola Button 6/9/202312:41 PM

## 2021-12-16 DIAGNOSIS — Z1389 Encounter for screening for other disorder: Secondary | ICD-10-CM | POA: Diagnosis not present

## 2021-12-16 DIAGNOSIS — C9100 Acute lymphoblastic leukemia not having achieved remission: Secondary | ICD-10-CM | POA: Diagnosis not present

## 2021-12-16 DIAGNOSIS — E663 Overweight: Secondary | ICD-10-CM | POA: Diagnosis not present

## 2021-12-16 DIAGNOSIS — Z0000 Encounter for general adult medical examination without abnormal findings: Secondary | ICD-10-CM | POA: Diagnosis not present

## 2021-12-16 DIAGNOSIS — M1990 Unspecified osteoarthritis, unspecified site: Secondary | ICD-10-CM | POA: Diagnosis not present

## 2021-12-16 DIAGNOSIS — R739 Hyperglycemia, unspecified: Secondary | ICD-10-CM | POA: Diagnosis not present

## 2021-12-16 DIAGNOSIS — Z1331 Encounter for screening for depression: Secondary | ICD-10-CM | POA: Diagnosis not present

## 2021-12-16 DIAGNOSIS — M8580 Other specified disorders of bone density and structure, unspecified site: Secondary | ICD-10-CM | POA: Diagnosis not present

## 2021-12-16 DIAGNOSIS — R82998 Other abnormal findings in urine: Secondary | ICD-10-CM | POA: Diagnosis not present

## 2021-12-16 DIAGNOSIS — E785 Hyperlipidemia, unspecified: Secondary | ICD-10-CM | POA: Diagnosis not present

## 2021-12-16 DIAGNOSIS — I10 Essential (primary) hypertension: Secondary | ICD-10-CM | POA: Diagnosis not present

## 2021-12-16 DIAGNOSIS — E559 Vitamin D deficiency, unspecified: Secondary | ICD-10-CM | POA: Diagnosis not present

## 2022-05-02 DIAGNOSIS — N390 Urinary tract infection, site not specified: Secondary | ICD-10-CM | POA: Diagnosis not present

## 2022-05-24 DIAGNOSIS — Z961 Presence of intraocular lens: Secondary | ICD-10-CM | POA: Diagnosis not present

## 2022-07-06 DIAGNOSIS — T798XXA Other early complications of trauma, initial encounter: Secondary | ICD-10-CM | POA: Diagnosis not present

## 2022-07-06 DIAGNOSIS — M79642 Pain in left hand: Secondary | ICD-10-CM | POA: Diagnosis not present

## 2022-07-06 DIAGNOSIS — Z23 Encounter for immunization: Secondary | ICD-10-CM | POA: Diagnosis not present

## 2022-07-19 ENCOUNTER — Telehealth: Payer: Self-pay | Admitting: Oncology

## 2022-07-19 NOTE — Telephone Encounter (Signed)
Called patient per Dr. Alen Blew transition. Forwarding information for [facility] facility. Patient notified.

## 2022-09-09 DIAGNOSIS — D224 Melanocytic nevi of scalp and neck: Secondary | ICD-10-CM | POA: Diagnosis not present

## 2022-09-09 DIAGNOSIS — L821 Other seborrheic keratosis: Secondary | ICD-10-CM | POA: Diagnosis not present

## 2022-09-09 DIAGNOSIS — L814 Other melanin hyperpigmentation: Secondary | ICD-10-CM | POA: Diagnosis not present

## 2022-09-09 DIAGNOSIS — L578 Other skin changes due to chronic exposure to nonionizing radiation: Secondary | ICD-10-CM | POA: Diagnosis not present

## 2022-09-09 DIAGNOSIS — L309 Dermatitis, unspecified: Secondary | ICD-10-CM | POA: Diagnosis not present

## 2022-09-09 DIAGNOSIS — L570 Actinic keratosis: Secondary | ICD-10-CM | POA: Diagnosis not present

## 2022-09-09 DIAGNOSIS — D225 Melanocytic nevi of trunk: Secondary | ICD-10-CM | POA: Diagnosis not present

## 2022-11-25 ENCOUNTER — Telehealth: Payer: Self-pay | Admitting: Nurse Practitioner

## 2022-11-25 NOTE — Telephone Encounter (Signed)
Spoke with patient confirming upcoming appointment change  

## 2022-12-05 ENCOUNTER — Emergency Department (HOSPITAL_BASED_OUTPATIENT_CLINIC_OR_DEPARTMENT_OTHER): Payer: PPO

## 2022-12-05 ENCOUNTER — Encounter (HOSPITAL_BASED_OUTPATIENT_CLINIC_OR_DEPARTMENT_OTHER): Payer: Self-pay | Admitting: Emergency Medicine

## 2022-12-05 ENCOUNTER — Emergency Department (HOSPITAL_BASED_OUTPATIENT_CLINIC_OR_DEPARTMENT_OTHER)
Admission: EM | Admit: 2022-12-05 | Discharge: 2022-12-05 | Disposition: A | Discharge status: Home / Self Care | Payer: PPO | Attending: Emergency Medicine | Admitting: Emergency Medicine

## 2022-12-05 DIAGNOSIS — S6992XA Unspecified injury of left wrist, hand and finger(s), initial encounter: Secondary | ICD-10-CM | POA: Diagnosis present

## 2022-12-05 DIAGNOSIS — S63642A Sprain of metacarpophalangeal joint of left thumb, initial encounter: Secondary | ICD-10-CM | POA: Diagnosis not present

## 2022-12-05 DIAGNOSIS — S0003XA Contusion of scalp, initial encounter: Secondary | ICD-10-CM

## 2022-12-05 DIAGNOSIS — W01198A Fall on same level from slipping, tripping and stumbling with subsequent striking against other object, initial encounter: Secondary | ICD-10-CM | POA: Insufficient documentation

## 2022-12-05 DIAGNOSIS — S0990XA Unspecified injury of head, initial encounter: Secondary | ICD-10-CM

## 2022-12-05 DIAGNOSIS — T1490XA Injury, unspecified, initial encounter: Secondary | ICD-10-CM | POA: Diagnosis not present

## 2022-12-05 DIAGNOSIS — Z79899 Other long term (current) drug therapy: Secondary | ICD-10-CM | POA: Diagnosis not present

## 2022-12-05 DIAGNOSIS — Z043 Encounter for examination and observation following other accident: Secondary | ICD-10-CM | POA: Diagnosis not present

## 2022-12-05 DIAGNOSIS — I10 Essential (primary) hypertension: Secondary | ICD-10-CM | POA: Diagnosis not present

## 2022-12-05 DIAGNOSIS — W19XXXA Unspecified fall, initial encounter: Secondary | ICD-10-CM

## 2022-12-05 MED ORDER — ACETAMINOPHEN 500 MG PO TABS
1000.0000 mg | ORAL_TABLET | Freq: Once | ORAL | Status: AC
  Administered 2022-12-05: 1000 mg via ORAL
  Filled 2022-12-05: qty 2

## 2022-12-05 NOTE — ED Triage Notes (Signed)
Pt arrived from home, POV with c/o fall. Hit head on concrete, no LOC, no c/o dizziness or lightheadedness. No bleeding noted, warm to touch and swollen on R posterior area. No blood thinners. Slight headache.   Pt a/ox4, VSS

## 2022-12-05 NOTE — Discharge Instructions (Signed)
Thank you for letting us take care of you today.  We did not find any significant findings or injuries related to your fall today. You may take over the counter medication as needed for pain and discomfort. Follow up with your PCP as needed. For any new or worsening symptoms including severe headache, vomiting, neck pain, chest pain, loss of consciousness, vision changes, or other new, concerning symptoms, please return to nearest ED for re-evaluation.

## 2022-12-05 NOTE — ED Provider Notes (Signed)
[place] EMERGENCY DEPARTMENT AT [facility] Provider Note   CSN: [id number] Arrival date & time: 12/05/22  1637     History  Chief Complaint  Patient presents with   Marletta Lor    [name] is a 82 y.o. female with PMH HTN who presents to ED c/o fall. Pt states she was getting out of car when she tripped and fell, bracing herself with her left hand and hitting the back of her head. No LOC, nausea, or vomiting. Remembers all details of fall. Pt not anticoagulated. No vision changes, focal weakness. No neck pain, back pain, or leg pain. C/o pain to left hand from bracing herself. No other UE pain. No chest or abdominal pain.       Home Medications Prior to Admission medications   Medication Sig Start Date End Date Taking? Authorizing Provider  azithromycin (ZITHROMAX) 250 MG tablet 2 tablet  on the first day, then 1 tablet daily for 4 days 05/27/20   [provider]  Calcium Carb-Cholecalciferol (CALCIUM + D3) 600-800 MG-UNIT TABS Take 1 tablet by mouth daily. 06/19/09   [provider]  cetirizine (ZYRTEC) 10 MG chewable tablet Chew 10 mg by mouth daily. As needed for allergies    [provider]  Fish Oil-Krill Oil (KRILL OIL PLUS PO) Take 1 tablet by mouth daily.    [provider]  glucosamine-chondroitin 500-400 MG tablet Take 1 tablet by mouth daily.    [provider]  HYDROCHLOROTHIAZIDE PO Take by mouth. Patient unsure of dose    [provider]  ibuprofen (ADVIL,MOTRIN) 200 MG tablet Take 200 mg by mouth every 6 (six) hours as needed.    [provider]  [place] Lanius 500 MG CAPS take one a day 09/20/18   [provider]  Multiple Vitamin (MULTIVITAMIN) capsule Take 1 capsule by mouth daily.    [provider]  olmesartan (BENICAR) 40 MG tablet Take 1 tablet by mouth daily.    [provider]  vitamin E 200 UNIT capsule Take 1 tablet by mouth daily. 06/19/09   [provider]  Zoster Vaccine Adjuvanted [facility]) injection OK for 2 part Vaccine 11/25/19   [provider]      Allergies    Bee venom and Penicillins    Review of Systems   Review of Systems  All other systems reviewed and are negative.   Physical Exam Updated Vital Signs BP (!) 148/85   Pulse 63   Temp (!) 97.1 F (36.2 C)   Resp 18   Wt 88.9 kg   SpO2 96%   BMI 28.94 kg/m  Physical Exam Vitals and nursing note reviewed.  Constitutional:      General: She is not in acute distress.    Appearance: Normal appearance. She is not ill-appearing or toxic-appearing.  HENT:     Head: Normocephalic and atraumatic.     Right Ear: External ear normal.     Left Ear: External ear normal.     Nose: Nose normal.     Mouth/Throat:     Mouth: Mucous membranes are moist.  Eyes:     General: No scleral icterus.    Extraocular Movements: Extraocular movements intact.     Conjunctiva/sclera: Conjunctivae normal.     Pupils: Pupils are equal, round, and reactive to light.  Cardiovascular:     Rate and Rhythm: Normal rate and regular rhythm.     Heart sounds: No murmur heard. Pulmonary:  Effort: Pulmonary effort is normal.     Breath sounds: Normal breath sounds.  Abdominal:     General: Abdomen is flat. There is no distension.     Palpations: Abdomen is soft.     Tenderness: There is no abdominal tenderness. There is no guarding or rebound.  Musculoskeletal:     Cervical back: Normal range of motion and neck supple. No rigidity or tenderness.     Comments: All extremities palpated and nontender without deformity apart from left thumb which has tenderness and questionable acute vs chronic deformity to lateral aspect, no open wounds; no midline CTL spinal tenderness, stepoffs, or deformities, NVID to all extremities  Skin:    General: Skin is warm and dry.     Capillary Refill: Capillary refill takes less than 2 seconds.  Neurological:     General: No focal deficit present.      Mental Status: She is alert and oriented to person, place, and time.     Cranial Nerves: No cranial nerve deficit.     Sensory: No sensory deficit.     Motor: No weakness.  Psychiatric:        Mood and Affect: Mood normal.        Behavior: Behavior normal.     ED Results / Procedures / Treatments   Labs (all labs ordered are listed, but only abnormal results are displayed) Labs Reviewed - No data to display  EKG None  Radiology CT Cervical Spine Wo Contrast  Result Date: 12/05/2022 CLINICAL DATA:  Trauma EXAM: CT CERVICAL SPINE WITHOUT CONTRAST TECHNIQUE: Multidetector CT imaging of the cervical spine was performed without intravenous contrast. Multiplanar CT image reconstructions were also generated. RADIATION DOSE REDUCTION: This exam was performed according to the departmental dose-optimization program which includes automated exposure control, adjustment of the mA and/or kV according to patient size and/or use of iterative reconstruction technique. COMPARISON:  None Available. FINDINGS: Alignment: There is 3 mm of anterolisthesis at C4-C5. Alignment is otherwise anatomic. Skull base and vertebrae: No acute fracture. No primary bone lesion or focal pathologic process. Soft tissues and spinal canal: No prevertebral fluid or swelling. No visible canal hematoma. Disc levels: There is disc space narrowing and endplate osteophyte formation throughout the cervical spine compatible with degenerative change. This is most significant at C5-C6 and C6-C7. There is moderate right neural foraminal stenosis at C3-C4 on the right secondary to uncovertebral spurring and facet arthropathy. There is no significant central canal stenosis at any level. Upper chest: Negative. Other: There is a hypodense left thyroid nodule measuring 12 mm. IMPRESSION: 1. No acute fracture or traumatic subluxation of the cervical spine. 2. Multilevel degenerative changes of the cervical spine. 3. Incidental left thyroid nodule  measuring 1.2 cm. No follow-up imaging is recommended. Reference: J Am Coll Radiol. 2015 Feb;12(2): 143-50 Electronically Signed   By: Darliss Cheney M.D.   On: 12/05/2022 19:10   CT Head Wo Contrast  Result Date: 12/05/2022 CLINICAL DATA:  Trauma EXAM: CT HEAD WITHOUT CONTRAST TECHNIQUE: Contiguous axial images were obtained from the base of the skull through the vertex without intravenous contrast. RADIATION DOSE REDUCTION: This exam was performed according to the departmental dose-optimization program which includes automated exposure control, adjustment of the mA and/or kV according to patient size and/or use of iterative reconstruction technique. COMPARISON:  None Available. FINDINGS: Brain: No evidence of acute infarction, hemorrhage, hydrocephalus, extra-axial collection or mass lesion/mass effect. There is mild diffuse atrophy and mild periventricular white matter hypodensity, likely chronic  small vessel ischemic change. Vascular: Atherosclerotic calcifications are present within the cavernous internal carotid arteries. Skull: Normal. Negative for fracture or focal lesion. Sinuses/Orbits: No acute finding. Other: Scalp swelling and hematoma overlying the posterior right parietal region. IMPRESSION: 1. No acute intracranial process. 2. Scalp swelling and hematoma overlying the posterior right parietal region. 3. Mild diffuse atrophy and mild periventricular white matter hypodensity, likely chronic small vessel ischemic change. Electronically Signed   By: Darliss Cheney M.D.   On: 12/05/2022 19:07   DG Hand Complete Left  Result Date: 12/05/2022 CLINICAL DATA:  Status post fall. EXAM: LEFT WRIST - COMPLETE 3+ VIEW; LEFT HAND - COMPLETE 3+ VIEW COMPARISON:  None Available. FINDINGS: Wrist: There is no evidence of fracture or dislocation. Mild radial downsloping which is chronic. Minimal distal radioulnar joint degenerative change. Soft tissues are unremarkable. Hand: No acute fracture or dislocation.  Multifocal osteoarthritis, most prominently affecting the thumb carpal metacarpal joint. No erosions. No focal soft tissue abnormalities are seen. IMPRESSION: 1. No acute fracture or dislocation of the left wrist or hand. 2. Multifocal osteoarthritis, most prominently affecting the thumb carpometacarpal joint. Electronically Signed   By: Narda Rutherford M.D.   On: 12/05/2022 18:49   DG Wrist Complete Left  Result Date: 12/05/2022 CLINICAL DATA:  Status post fall. EXAM: LEFT WRIST - COMPLETE 3+ VIEW; LEFT HAND - COMPLETE 3+ VIEW COMPARISON:  None Available. FINDINGS: Wrist: There is no evidence of fracture or dislocation. Mild radial downsloping which is chronic. Minimal distal radioulnar joint degenerative change. Soft tissues are unremarkable. Hand: No acute fracture or dislocation. Multifocal osteoarthritis, most prominently affecting the thumb carpal metacarpal joint. No erosions. No focal soft tissue abnormalities are seen. IMPRESSION: 1. No acute fracture or dislocation of the left wrist or hand. 2. Multifocal osteoarthritis, most prominently affecting the thumb carpometacarpal joint. Electronically Signed   By: Narda Rutherford M.D.   On: 12/05/2022 18:49    Procedures Procedures    Medications Ordered in ED Medications  acetaminophen (TYLENOL) tablet 1,000 mg (1,000 mg Oral Given 12/05/22 1911)    ED Course/ Medical Decision Making/ A&P                             Medical Decision Making Amount and/or Complexity of Data Reviewed Radiology: ordered. Decision-making details documented in ED Course.  Risk OTC drugs.   Medical Decision Making:   ELIDA DESAUTELS is a 82 y.o. female who presented to the ED today with head injury detailed above.    Additional history discussed with patient's family/caregivers.  Patient's presentation is complicated by their history of advanced age.  Complete initial physical exam performed, notably the patient was in NAD. Nonfocal neuro exam. No signs of  head trauma. No midline spinal tenderness.  Tenderness to L thumb. Remainder of extremities with full range of motion and nontender.  Reviewed and confirmed nursing documentation for past medical history, family history, social history.    Initial Assessment:   With the patient's presentation, differential diagnosis includes but is not limited to ICH/SAH, head injury, disk herniation, muscle strain, contusion, hematoma, laceration, sprain, strain, fracture, dislocation.    This is most consistent with an acute complicated illness  Initial Plan:  CT brain/cervical spine to assess for traumatic injury X-ray L hand and wrist to assess for traumatic injury Objective evaluation as below reviewed   Initial Study Results:   Radiology:  All images reviewed independently. Agree with radiology report  at this time.   CT Cervical Spine Wo Contrast  Result Date: 12/05/2022 CLINICAL DATA:  Trauma EXAM: CT CERVICAL SPINE WITHOUT CONTRAST TECHNIQUE: Multidetector CT imaging of the cervical spine was performed without intravenous contrast. Multiplanar CT image reconstructions were also generated. RADIATION DOSE REDUCTION: This exam was performed according to the departmental dose-optimization program which includes automated exposure control, adjustment of the mA and/or kV according to patient size and/or use of iterative reconstruction technique. COMPARISON:  None Available. FINDINGS: Alignment: There is 3 mm of anterolisthesis at C4-C5. Alignment is otherwise anatomic. Skull base and vertebrae: No acute fracture. No primary bone lesion or focal pathologic process. Soft tissues and spinal canal: No prevertebral fluid or swelling. No visible canal hematoma. Disc levels: There is disc space narrowing and endplate osteophyte formation throughout the cervical spine compatible with degenerative change. This is most significant at C5-C6 and C6-C7. There is moderate right neural foraminal stenosis at C3-C4 on the right  secondary to uncovertebral spurring and facet arthropathy. There is no significant central canal stenosis at any level. Upper chest: Negative. Other: There is a hypodense left thyroid nodule measuring 12 mm. IMPRESSION: 1. No acute fracture or traumatic subluxation of the cervical spine. 2. Multilevel degenerative changes of the cervical spine. 3. Incidental left thyroid nodule measuring 1.2 cm. No follow-up imaging is recommended. Reference: J Am Coll Radiol. 2015 Feb;12(2): 143-50 Electronically Signed   By: Darliss Cheney M.D.   On: 12/05/2022 19:10   CT Head Wo Contrast  Result Date: 12/05/2022 CLINICAL DATA:  Trauma EXAM: CT HEAD WITHOUT CONTRAST TECHNIQUE: Contiguous axial images were obtained from the base of the skull through the vertex without intravenous contrast. RADIATION DOSE REDUCTION: This exam was performed according to the departmental dose-optimization program which includes automated exposure control, adjustment of the mA and/or kV according to patient size and/or use of iterative reconstruction technique. COMPARISON:  None Available. FINDINGS: Brain: No evidence of acute infarction, hemorrhage, hydrocephalus, extra-axial collection or mass lesion/mass effect. There is mild diffuse atrophy and mild periventricular white matter hypodensity, likely chronic small vessel ischemic change. Vascular: Atherosclerotic calcifications are present within the cavernous internal carotid arteries. Skull: Normal. Negative for fracture or focal lesion. Sinuses/Orbits: No acute finding. Other: Scalp swelling and hematoma overlying the posterior right parietal region. IMPRESSION: 1. No acute intracranial process. 2. Scalp swelling and hematoma overlying the posterior right parietal region. 3. Mild diffuse atrophy and mild periventricular white matter hypodensity, likely chronic small vessel ischemic change. Electronically Signed   By: Darliss Cheney M.D.   On: 12/05/2022 19:07   DG Hand Complete Left  Result  Date: 12/05/2022 CLINICAL DATA:  Status post fall. EXAM: LEFT WRIST - COMPLETE 3+ VIEW; LEFT HAND - COMPLETE 3+ VIEW COMPARISON:  None Available. FINDINGS: Wrist: There is no evidence of fracture or dislocation. Mild radial downsloping which is chronic. Minimal distal radioulnar joint degenerative change. Soft tissues are unremarkable. Hand: No acute fracture or dislocation. Multifocal osteoarthritis, most prominently affecting the thumb carpal metacarpal joint. No erosions. No focal soft tissue abnormalities are seen. IMPRESSION: 1. No acute fracture or dislocation of the left wrist or hand. 2. Multifocal osteoarthritis, most prominently affecting the thumb carpometacarpal joint. Electronically Signed   By: Narda Rutherford M.D.   On: 12/05/2022 18:49   DG Wrist Complete Left  Result Date: 12/05/2022 CLINICAL DATA:  Status post fall. EXAM: LEFT WRIST - COMPLETE 3+ VIEW; LEFT HAND - COMPLETE 3+ VIEW COMPARISON:  None Available. FINDINGS: Wrist: There is no evidence  of fracture or dislocation. Mild radial downsloping which is chronic. Minimal distal radioulnar joint degenerative change. Soft tissues are unremarkable. Hand: No acute fracture or dislocation. Multifocal osteoarthritis, most prominently affecting the thumb carpal metacarpal joint. No erosions. No focal soft tissue abnormalities are seen. IMPRESSION: 1. No acute fracture or dislocation of the left wrist or hand. 2. Multifocal osteoarthritis, most prominently affecting the thumb carpometacarpal joint. Electronically Signed   By: Narda Rutherford M.D.   On: 12/05/2022 18:49      Final Assessment and Plan:   82 year old female presents to ED for evaluation post mechanical fall. Hit head and injured left thumb. Neurovascularly intact. Deformity appears likely chronic, range of motion intact. Nonfocal neuro exam. No evidence of severe head trauma, open wounds. Imaging obtained for evaluation given pt's age. No anticoagulants. No acute findings on imaging  apart from scalp hematoma. No midline spinal tenderness. Discussed all findings with pt/daughter including discharge instructions and strict ED return precautions. All questions answered and stable for discharge.     Clinical Impression:  1. Fall, initial encounter   2. Injury of head, initial encounter   3. Hematoma of scalp, initial encounter   4. Sprain of metacarpophalangeal (MCP) joint of left thumb, initial encounter      Discharge           Final Clinical Impression(s) / ED Diagnoses Final diagnoses:  Fall, initial encounter  Injury of head, initial encounter  Hematoma of scalp, initial encounter  Sprain of metacarpophalangeal (MCP) joint of left thumb, initial encounter    Rx / DC Orders ED Discharge Orders     None         Richardson Dopp 12/05/22 Lear Ng, MD 12/05/22 2346

## 2022-12-06 ENCOUNTER — Inpatient Hospital Stay: Payer: PPO

## 2022-12-06 ENCOUNTER — Inpatient Hospital Stay: Payer: PPO | Admitting: Nurse Practitioner

## 2022-12-08 ENCOUNTER — Other Ambulatory Visit: Payer: Self-pay | Admitting: Internal Medicine

## 2022-12-08 DIAGNOSIS — Z1231 Encounter for screening mammogram for malignant neoplasm of breast: Secondary | ICD-10-CM

## 2022-12-09 ENCOUNTER — Telehealth: Payer: Self-pay

## 2022-12-09 NOTE — Telephone Encounter (Signed)
Transition Care Management Follow-up Telephone Call Date of discharge and from where: [facility] How have you been since you were released from the hospital? Good  Any questions or concerns? No  Items Reviewed: Did the pt receive and understand the discharge instructions provided? Yes  Medications obtained and verified? No  Other? No  Any new allergies since your discharge? No  Dietary orders reviewed? No Do you have support at home? Yes    Follow up appointments reviewed:  PCP Hospital f/u appt confirmed? Yes  Scheduled to see PCP  on OVER THE PHONE  @ . Specialist Hospital f/u appt confirmed? No  Scheduled to see  on  @ . Are transportation arrangements needed? No  If their condition worsens, is the pt aware to call PCP or go to the Emergency Dept.? Yes Was the patient provided with contact information for the PCP's office or ED? Yes Was to pt encouraged to call back with questions or concerns? Yes

## 2022-12-12 ENCOUNTER — Ambulatory Visit
Admission: RE | Admit: 2022-12-12 | Discharge: 2022-12-12 | Disposition: A | Discharge status: Home / Self Care | Payer: PPO | Source: Ambulatory Visit | Attending: Internal Medicine | Admitting: Internal Medicine

## 2022-12-12 DIAGNOSIS — Z1231 Encounter for screening mammogram for malignant neoplasm of breast: Secondary | ICD-10-CM | POA: Diagnosis not present

## 2022-12-13 ENCOUNTER — Ambulatory Visit: Payer: PPO | Admitting: Oncology

## 2022-12-13 ENCOUNTER — Other Ambulatory Visit: Payer: PPO

## 2022-12-21 DIAGNOSIS — E785 Hyperlipidemia, unspecified: Secondary | ICD-10-CM | POA: Diagnosis not present

## 2022-12-21 DIAGNOSIS — E559 Vitamin D deficiency, unspecified: Secondary | ICD-10-CM | POA: Diagnosis not present

## 2022-12-21 DIAGNOSIS — I10 Essential (primary) hypertension: Secondary | ICD-10-CM | POA: Diagnosis not present

## 2022-12-21 DIAGNOSIS — R739 Hyperglycemia, unspecified: Secondary | ICD-10-CM | POA: Diagnosis not present

## 2022-12-21 DIAGNOSIS — K5900 Constipation, unspecified: Secondary | ICD-10-CM | POA: Diagnosis not present

## 2022-12-21 DIAGNOSIS — R7989 Other specified abnormal findings of blood chemistry: Secondary | ICD-10-CM | POA: Diagnosis not present

## 2023-01-02 ENCOUNTER — Other Ambulatory Visit: Payer: Self-pay | Admitting: *Deleted

## 2023-01-02 DIAGNOSIS — M1990 Unspecified osteoarthritis, unspecified site: Secondary | ICD-10-CM | POA: Diagnosis not present

## 2023-01-02 DIAGNOSIS — Z0000 Encounter for general adult medical examination without abnormal findings: Secondary | ICD-10-CM | POA: Diagnosis not present

## 2023-01-02 DIAGNOSIS — C9110 Chronic lymphocytic leukemia of B-cell type not having achieved remission: Secondary | ICD-10-CM

## 2023-01-02 DIAGNOSIS — I10 Essential (primary) hypertension: Secondary | ICD-10-CM | POA: Diagnosis not present

## 2023-01-02 DIAGNOSIS — E663 Overweight: Secondary | ICD-10-CM | POA: Diagnosis not present

## 2023-01-02 DIAGNOSIS — C9100 Acute lymphoblastic leukemia not having achieved remission: Secondary | ICD-10-CM | POA: Diagnosis not present

## 2023-01-02 DIAGNOSIS — Z1331 Encounter for screening for depression: Secondary | ICD-10-CM | POA: Diagnosis not present

## 2023-01-02 DIAGNOSIS — E785 Hyperlipidemia, unspecified: Secondary | ICD-10-CM | POA: Diagnosis not present

## 2023-01-02 DIAGNOSIS — Z1339 Encounter for screening examination for other mental health and behavioral disorders: Secondary | ICD-10-CM | POA: Diagnosis not present

## 2023-01-02 DIAGNOSIS — M8580 Other specified disorders of bone density and structure, unspecified site: Secondary | ICD-10-CM | POA: Diagnosis not present

## 2023-01-02 DIAGNOSIS — G319 Degenerative disease of nervous system, unspecified: Secondary | ICD-10-CM | POA: Diagnosis not present

## 2023-01-02 NOTE — Progress Notes (Unsigned)
  [facility] OFFICE PROGRESS NOTE   Diagnosis: CLL diagnosed 2013  INTERVAL HISTORY:   Ms. [name] is an 82 year old woman previously followed by Dr. Clelia Croft for diagnosis of CLL.  Diagnosis dates to 2013.  She was found to have stage 0 disease with lymphocytosis and no indication for treatment.  Last office visit 12/10/2021 indicates mild elevation of the lymphocyte count but not dramatically changed over 11 years; remains asymptomatic with no indication for treatment; annual surveillance recommended.  She feels well.  No infections.  Good appetite.  Weight is stable.  No fevers or sweats.  She has not noticed any enlarged lymph nodes.  Objective:  Vital signs in last 24 hours:  Blood pressure (!) 141/87, pulse 70, temperature 98.2 F (36.8 C), temperature source Oral, resp. rate 18, height 5\' 9"  (1.753 m), weight 198 lb 14.4 oz (90.2 kg), SpO2 99 %.    HEENT: No thrush or ulcers. Lymphatics: No palpable cervical, supraclavicular, axillary or inguinal lymph nodes. Resp: Lungs clear bilaterally. Cardio: Regular rate and rhythm. GI: No hepatosplenomegaly. Vascular: No leg edema.   Lab Results:  Lab Results  Component Value Date   WBC 15.6 (H) 01/03/2023   HGB 13.4 01/03/2023   HCT 40.7 01/03/2023   MCV 90.6 01/03/2023   PLT 256 01/03/2023   NEUTROABS PENDING 01/03/2023    Imaging:  No results found.  Medications: I have reviewed the patient's current medications.  Assessment/Plan: CLL, early stage, diagnosed 2013 Hypertension  Disposition: [name] has early stage CLL.  She has been followed by Dr. Clelia Croft since 2013.  She has never required treatment.  CBC is stable with mild lymphocytosis, normal hemoglobin and platelet count.  We reviewed the indications for treatment.  She understands the recommendation is to continue to follow with observation.  She agrees with this plan.  We discussed the importance of remaining up-to-date on vaccines.  She  understands to seek evaluation for signs of infection.  The chemistry panel from today shows an elevated calcium level.  She will return in 2 weeks to repeat this.  She will return for follow-up in 1 year.  We are available to see her sooner if needed.    Lonna Cobb ANP/GNP-BC   01/03/2023  11:36 AM

## 2023-01-03 ENCOUNTER — Inpatient Hospital Stay: Payer: PPO | Admitting: Nurse Practitioner

## 2023-01-03 ENCOUNTER — Inpatient Hospital Stay: Payer: PPO | Attending: Nurse Practitioner

## 2023-01-03 ENCOUNTER — Encounter: Payer: Self-pay | Admitting: Nurse Practitioner

## 2023-01-03 VITALS — BP 141/87 | HR 70 | Temp 98.2°F | Resp 18 | Ht 69.0 in | Wt 198.9 lb

## 2023-01-03 DIAGNOSIS — C9110 Chronic lymphocytic leukemia of B-cell type not having achieved remission: Secondary | ICD-10-CM

## 2023-01-03 LAB — CBC WITH DIFFERENTIAL (CANCER CENTER ONLY)
Abs Immature Granulocytes: 0.03 10*3/uL (ref 0.00–0.07)
Basophils Absolute: 0.1 10*3/uL (ref 0.0–0.1)
Basophils Relative: 0 %
Eosinophils Absolute: 0.2 10*3/uL (ref 0.0–0.5)
Eosinophils Relative: 1 %
HCT: 40.7 % (ref 36.0–46.0)
Hemoglobin: 13.4 g/dL (ref 12.0–15.0)
Immature Granulocytes: 0 %
Lymphocytes Relative: 69 %
Lymphs Abs: 10.6 10*3/uL — ABNORMAL HIGH (ref 0.7–4.0)
MCH: 29.8 pg (ref 26.0–34.0)
MCHC: 32.9 g/dL (ref 30.0–36.0)
MCV: 90.6 fL (ref 80.0–100.0)
Monocytes Absolute: 0.7 10*3/uL (ref 0.1–1.0)
Monocytes Relative: 4 %
Neutro Abs: 4.1 10*3/uL (ref 1.7–7.7)
Neutrophils Relative %: 26 %
Platelet Count: 256 10*3/uL (ref 150–400)
RBC: 4.49 MIL/uL (ref 3.87–5.11)
RDW: 14 % (ref 11.5–15.5)
WBC Count: 15.6 10*3/uL — ABNORMAL HIGH (ref 4.0–10.5)
nRBC: 0 % (ref 0.0–0.2)

## 2023-01-03 LAB — CMP (CANCER CENTER ONLY)
ALT: 12 U/L (ref 0–44)
AST: 17 U/L (ref 15–41)
Albumin: 4.9 g/dL (ref 3.5–5.0)
Alkaline Phosphatase: 64 U/L (ref 38–126)
Anion gap: 9 (ref 5–15)
BUN: 23 mg/dL (ref 8–23)
CO2: 26 mmol/L (ref 22–32)
Calcium: 10.8 mg/dL — ABNORMAL HIGH (ref 8.9–10.3)
Chloride: 104 mmol/L (ref 98–111)
Creatinine: 0.8 mg/dL (ref 0.44–1.00)
GFR, Estimated: >60 mL/min
Glucose, Bld: 106 mg/dL — ABNORMAL HIGH (ref 70–99)
Potassium: 4.3 mmol/L (ref 3.5–5.1)
Sodium: 139 mmol/L (ref 135–145)
Total Bilirubin: 0.7 mg/dL (ref 0.3–1.2)
Total Protein: 7.1 g/dL (ref 6.5–8.1)

## 2023-01-17 ENCOUNTER — Other Ambulatory Visit: Payer: Self-pay | Admitting: *Deleted

## 2023-01-17 ENCOUNTER — Inpatient Hospital Stay: Payer: PPO

## 2023-01-17 DIAGNOSIS — C9110 Chronic lymphocytic leukemia of B-cell type not having achieved remission: Secondary | ICD-10-CM | POA: Diagnosis not present

## 2023-01-17 LAB — BASIC METABOLIC PANEL - CANCER CENTER ONLY
Anion gap: 8 (ref 5–15)
BUN: 29 mg/dL — ABNORMAL HIGH (ref 8–23)
CO2: 27 mmol/L (ref 22–32)
Calcium: 9.7 mg/dL (ref 8.9–10.3)
Chloride: 104 mmol/L (ref 98–111)
Creatinine: 0.79 mg/dL (ref 0.44–1.00)
GFR, Estimated: >60 mL/min (ref >60)
Glucose, Bld: 121 mg/dL — ABNORMAL HIGH (ref 70–99)
Potassium: 4.2 mmol/L (ref 3.5–5.1)
Sodium: 139 mmol/L (ref 135–145)

## 2023-01-18 ENCOUNTER — Telehealth: Payer: Self-pay

## 2023-01-18 NOTE — Telephone Encounter (Signed)
Patient gave verbal understanding and had no further questions or concerns  

## 2023-01-18 NOTE — Telephone Encounter (Signed)
-----   Message from Lonna Cobb sent at 01/17/2023  5:53 PM EDT ----- Please let her know the repeat calcium level is normal.  Follow-up as scheduled.

## 2023-05-31 DIAGNOSIS — Z961 Presence of intraocular lens: Secondary | ICD-10-CM | POA: Diagnosis not present

## 2023-11-10 DIAGNOSIS — L720 Epidermal cyst: Secondary | ICD-10-CM | POA: Diagnosis not present

## 2023-11-10 DIAGNOSIS — L821 Other seborrheic keratosis: Secondary | ICD-10-CM | POA: Diagnosis not present

## 2023-11-10 DIAGNOSIS — L814 Other melanin hyperpigmentation: Secondary | ICD-10-CM | POA: Diagnosis not present

## 2023-11-10 DIAGNOSIS — D225 Melanocytic nevi of trunk: Secondary | ICD-10-CM | POA: Diagnosis not present

## 2023-11-16 DIAGNOSIS — I10 Essential (primary) hypertension: Secondary | ICD-10-CM | POA: Diagnosis not present

## 2023-11-16 DIAGNOSIS — R682 Dry mouth, unspecified: Secondary | ICD-10-CM | POA: Diagnosis not present

## 2023-11-16 DIAGNOSIS — R739 Hyperglycemia, unspecified: Secondary | ICD-10-CM | POA: Diagnosis not present

## 2023-11-16 DIAGNOSIS — H04123 Dry eye syndrome of bilateral lacrimal glands: Secondary | ICD-10-CM | POA: Diagnosis not present

## 2023-11-16 DIAGNOSIS — J302 Other seasonal allergic rhinitis: Secondary | ICD-10-CM | POA: Diagnosis not present

## 2023-12-18 ENCOUNTER — Other Ambulatory Visit: Payer: Self-pay | Admitting: Internal Medicine

## 2023-12-18 DIAGNOSIS — Z1231 Encounter for screening mammogram for malignant neoplasm of breast: Secondary | ICD-10-CM

## 2024-01-03 ENCOUNTER — Other Ambulatory Visit: Payer: Self-pay

## 2024-01-03 ENCOUNTER — Ambulatory Visit: Payer: PPO | Admitting: Oncology

## 2024-01-03 ENCOUNTER — Other Ambulatory Visit: Payer: PPO

## 2024-01-08 DIAGNOSIS — I10 Essential (primary) hypertension: Secondary | ICD-10-CM | POA: Diagnosis not present

## 2024-01-08 DIAGNOSIS — E559 Vitamin D deficiency, unspecified: Secondary | ICD-10-CM | POA: Diagnosis not present

## 2024-01-08 DIAGNOSIS — E785 Hyperlipidemia, unspecified: Secondary | ICD-10-CM | POA: Diagnosis not present

## 2024-01-09 ENCOUNTER — Ambulatory Visit

## 2024-01-12 DIAGNOSIS — C9100 Acute lymphoblastic leukemia not having achieved remission: Secondary | ICD-10-CM | POA: Diagnosis not present

## 2024-01-12 DIAGNOSIS — N329 Bladder disorder, unspecified: Secondary | ICD-10-CM | POA: Diagnosis not present

## 2024-01-12 DIAGNOSIS — I10 Essential (primary) hypertension: Secondary | ICD-10-CM | POA: Diagnosis not present

## 2024-01-12 DIAGNOSIS — Z1339 Encounter for screening examination for other mental health and behavioral disorders: Secondary | ICD-10-CM | POA: Diagnosis not present

## 2024-01-12 DIAGNOSIS — R82998 Other abnormal findings in urine: Secondary | ICD-10-CM | POA: Diagnosis not present

## 2024-01-12 DIAGNOSIS — R739 Hyperglycemia, unspecified: Secondary | ICD-10-CM | POA: Diagnosis not present

## 2024-01-12 DIAGNOSIS — C300 Malignant neoplasm of nasal cavity: Secondary | ICD-10-CM | POA: Diagnosis not present

## 2024-01-12 DIAGNOSIS — J302 Other seasonal allergic rhinitis: Secondary | ICD-10-CM | POA: Diagnosis not present

## 2024-01-12 DIAGNOSIS — M8580 Other specified disorders of bone density and structure, unspecified site: Secondary | ICD-10-CM | POA: Diagnosis not present

## 2024-01-12 DIAGNOSIS — G319 Degenerative disease of nervous system, unspecified: Secondary | ICD-10-CM | POA: Diagnosis not present

## 2024-01-12 DIAGNOSIS — Z23 Encounter for immunization: Secondary | ICD-10-CM | POA: Diagnosis not present

## 2024-01-12 DIAGNOSIS — E785 Hyperlipidemia, unspecified: Secondary | ICD-10-CM | POA: Diagnosis not present

## 2024-01-12 DIAGNOSIS — Z0000 Encounter for general adult medical examination without abnormal findings: Secondary | ICD-10-CM | POA: Diagnosis not present

## 2024-01-12 DIAGNOSIS — M2040 Other hammer toe(s) (acquired), unspecified foot: Secondary | ICD-10-CM | POA: Diagnosis not present

## 2024-01-12 DIAGNOSIS — Z1331 Encounter for screening for depression: Secondary | ICD-10-CM | POA: Diagnosis not present

## 2024-01-12 DIAGNOSIS — Z91030 Bee allergy status: Secondary | ICD-10-CM | POA: Diagnosis not present

## 2024-01-24 ENCOUNTER — Ambulatory Visit

## 2024-01-25 ENCOUNTER — Ambulatory Visit
Admission: RE | Admit: 2024-01-25 | Discharge: 2024-01-25 | Disposition: A | Discharge status: Home / Self Care | Source: Ambulatory Visit | Attending: Internal Medicine | Admitting: Internal Medicine

## 2024-01-25 ENCOUNTER — Inpatient Hospital Stay: Admitting: Oncology

## 2024-01-25 ENCOUNTER — Inpatient Hospital Stay: Attending: Oncology

## 2024-01-25 VITALS — BP 126/92 | HR 90 | Temp 97.8°F | Resp 18 | Ht 69.0 in | Wt 193.9 lb

## 2024-01-25 DIAGNOSIS — Z1231 Encounter for screening mammogram for malignant neoplasm of breast: Secondary | ICD-10-CM

## 2024-01-25 DIAGNOSIS — C9110 Chronic lymphocytic leukemia of B-cell type not having achieved remission: Secondary | ICD-10-CM | POA: Diagnosis not present

## 2024-01-25 DIAGNOSIS — I10 Essential (primary) hypertension: Secondary | ICD-10-CM | POA: Diagnosis not present

## 2024-01-25 DIAGNOSIS — C9111 Chronic lymphocytic leukemia of B-cell type in remission: Secondary | ICD-10-CM | POA: Diagnosis not present

## 2024-01-25 LAB — CBC WITH DIFFERENTIAL (CANCER CENTER ONLY)
Abs Immature Granulocytes: 0.02 K/uL (ref 0.00–0.07)
Basophils Absolute: 0.1 K/uL (ref 0.0–0.1)
Basophils Relative: 0 %
Eosinophils Absolute: 0.1 K/uL (ref 0.0–0.5)
Eosinophils Relative: 1 %
HCT: 39.5 % (ref 36.0–46.0)
Hemoglobin: 12.6 g/dL (ref 12.0–15.0)
Immature Granulocytes: 0 %
Lymphocytes Relative: 69 %
Lymphs Abs: 9.8 K/uL — ABNORMAL HIGH (ref 0.7–4.0)
MCH: 28.6 pg (ref 26.0–34.0)
MCHC: 31.9 g/dL (ref 30.0–36.0)
MCV: 89.6 fL (ref 80.0–100.0)
Monocytes Absolute: 0.5 K/uL (ref 0.1–1.0)
Monocytes Relative: 3 %
Neutro Abs: 3.8 K/uL (ref 1.7–7.7)
Neutrophils Relative %: 27 %
Platelet Count: 264 K/uL (ref 150–400)
RBC: 4.41 MIL/uL (ref 3.87–5.11)
RDW: 14.1 % (ref 11.5–15.5)
WBC Count: 14.1 K/uL — ABNORMAL HIGH (ref 4.0–10.5)
nRBC: 0 % (ref 0.0–0.2)

## 2024-01-25 LAB — CMP (CANCER CENTER ONLY)
ALT: 18 U/L (ref 0–44)
AST: 25 U/L (ref 15–41)
Albumin: 4.5 g/dL (ref 3.5–5.0)
Alkaline Phosphatase: 70 U/L (ref 38–126)
Anion gap: 12 (ref 5–15)
BUN: 17 mg/dL (ref 8–23)
CO2: 25 mmol/L (ref 22–32)
Calcium: 10.6 mg/dL — ABNORMAL HIGH (ref 8.9–10.3)
Chloride: 105 mmol/L (ref 98–111)
Creatinine: 0.89 mg/dL (ref 0.44–1.00)
GFR, Estimated: >60 mL/min (ref >60)
Glucose, Bld: 113 mg/dL — ABNORMAL HIGH (ref 70–99)
Potassium: 4.7 mmol/L (ref 3.5–5.1)
Sodium: 141 mmol/L (ref 135–145)
Total Bilirubin: 0.7 mg/dL (ref 0.0–1.2)
Total Protein: 6.9 g/dL (ref 6.5–8.1)

## 2024-01-25 NOTE — Progress Notes (Signed)
  [name] OFFICE PROGRESS NOTE   Diagnosis: CLL  INTERVAL HISTORY:   [name] turns as scheduled.  She feels well.  Good appetite.  She reports intentional weight loss.  No fever, night sweats, or palpable lymph nodes.  No recent infection.  She reports being up-to-date on influenza and pneumonia vaccines.  Objective:  Vital signs in last 24 hours:  Blood pressure (!) 126/92, pulse 90, temperature 97.8 F (36.6 C), temperature source Axillary, resp. rate 18, height 5' 9 (1.753 m), weight 193 lb 14.4 oz (88 kg), SpO2 98%.   Lymphatics: No cervical, supraclavicular, or inguinal nodes.  Soft mobile high medial left axillary node, no right axillary nodes Resp: Scattered coarse end inspiratory rhonchi, no respiratory distress Cardio: Regular rate and rhythm GI: No hepatosplenomegaly Vascular: No leg edema Breast: Left breast without mass   Lab Results:  Lab Results  Component Value Date   WBC 14.1 (H) 01/25/2024   HGB 12.6 01/25/2024   HCT 39.5 01/25/2024   MCV 89.6 01/25/2024   PLT 264 01/25/2024   NEUTROABS 3.8 01/25/2024    CMP  Lab Results  Component Value Date   NA 141 01/25/2024   K 4.7 01/25/2024   CL 105 01/25/2024   CO2 25 01/25/2024   GLUCOSE 113 (H) 01/25/2024   BUN 17 01/25/2024   CREATININE 0.89 01/25/2024   CALCIUM 10.6 (H) 01/25/2024   PROT 6.9 01/25/2024   ALBUMIN 4.5 01/25/2024   AST 25 01/25/2024   ALT 18 01/25/2024   ALKPHOS 70 01/25/2024   BILITOT 0.7 01/25/2024   GFRNONAA >60 01/25/2024   GFRAA >60 10/03/2019    Medications: I have reviewed the patient's current medications.   Assessment/Plan:  CLL, early stage, diagnosed 2013 Hypertension   Disposition: [name] has early stage CLL.  She is stable from a hematologic standpoint and asymptomatic from the CLL.  There is no indication for treating the CLL.  She will remain up-to-date on the influenza and pneumonia vaccines.  She will seek medical attention for  symptoms of an infection.  She will return for an office visit and CBC in 1 year. The calcium level is mildly elevated again today.  This is likely related to benign normal variation or calcium supplement.  The calcium was mildly elevated last year and a repeat calcium level returned normal.  The small left axillary lymph node is likely a benign finding or potentially related to CLL.  She is scheduled for a mammogram today.  Arley Hof, MD  01/25/2024  9:11 AM

## 2024-02-28 DIAGNOSIS — Z1211 Encounter for screening for malignant neoplasm of colon: Secondary | ICD-10-CM | POA: Diagnosis not present

## 2024-03-01 DIAGNOSIS — Z1152 Encounter for screening for COVID-19: Secondary | ICD-10-CM | POA: Diagnosis not present

## 2024-03-01 DIAGNOSIS — R0981 Nasal congestion: Secondary | ICD-10-CM | POA: Diagnosis not present

## 2024-03-01 DIAGNOSIS — K117 Disturbances of salivary secretion: Secondary | ICD-10-CM | POA: Diagnosis not present

## 2024-03-01 DIAGNOSIS — J0110 Acute frontal sinusitis, unspecified: Secondary | ICD-10-CM | POA: Diagnosis not present

## 2024-03-01 DIAGNOSIS — M542 Cervicalgia: Secondary | ICD-10-CM | POA: Diagnosis not present

## 2024-03-01 DIAGNOSIS — R5383 Other fatigue: Secondary | ICD-10-CM | POA: Diagnosis not present

## 2024-04-01 ENCOUNTER — Telehealth: Payer: Self-pay

## 2024-04-01 NOTE — Telephone Encounter (Signed)
 The patient's schedule has been updated to reflect the provider's new office hours. A reminder letter has been sent with the revised appointment time.

## 2024-04-23 DIAGNOSIS — M8589 Other specified disorders of bone density and structure, multiple sites: Secondary | ICD-10-CM | POA: Diagnosis not present

## 2024-06-12 DIAGNOSIS — Z961 Presence of intraocular lens: Secondary | ICD-10-CM | POA: Diagnosis not present

## 2025-01-23 ENCOUNTER — Other Ambulatory Visit

## 2025-01-23 ENCOUNTER — Ambulatory Visit: Admitting: Oncology

## 2025-01-24 ENCOUNTER — Other Ambulatory Visit

## 2025-01-24 ENCOUNTER — Ambulatory Visit: Admitting: Oncology
